# Patient Record
Sex: Male | Born: 1960 | Race: Black or African American | Hispanic: No | Marital: Single | Smoking: Former smoker
Health system: Southern US, Community
[De-identification: ages and names within clinical notes are randomized; demographics above are authoritative.]

## PROBLEM LIST (undated history)

## (undated) DIAGNOSIS — I639 Cerebral infarction, unspecified: Secondary | ICD-10-CM

## (undated) DIAGNOSIS — J449 Chronic obstructive pulmonary disease, unspecified: Secondary | ICD-10-CM

## (undated) DIAGNOSIS — I10 Essential (primary) hypertension: Secondary | ICD-10-CM

## (undated) HISTORY — PX: HEMORROIDECTOMY: SUR656

## (undated) HISTORY — PX: PROSTATE BIOPSY: SHX241

## (undated) HISTORY — PX: WRIST SURGERY: SHX841

---

## 2015-02-27 ENCOUNTER — Emergency Department (HOSPITAL_BASED_OUTPATIENT_CLINIC_OR_DEPARTMENT_OTHER)
Admission: EM | Admit: 2015-02-27 | Discharge: 2015-02-27 | Disposition: A | Discharge status: Home / Self Care | Payer: No Typology Code available for payment source | Attending: Emergency Medicine | Admitting: Emergency Medicine

## 2015-02-27 ENCOUNTER — Encounter (HOSPITAL_BASED_OUTPATIENT_CLINIC_OR_DEPARTMENT_OTHER): Payer: Self-pay | Admitting: Emergency Medicine

## 2015-02-27 DIAGNOSIS — I10 Essential (primary) hypertension: Secondary | ICD-10-CM | POA: Diagnosis not present

## 2015-02-27 DIAGNOSIS — Y92410 Unspecified street and highway as the place of occurrence of the external cause: Secondary | ICD-10-CM | POA: Diagnosis not present

## 2015-02-27 DIAGNOSIS — S79912A Unspecified injury of left hip, initial encounter: Secondary | ICD-10-CM | POA: Diagnosis not present

## 2015-02-27 DIAGNOSIS — S199XXA Unspecified injury of neck, initial encounter: Secondary | ICD-10-CM | POA: Diagnosis present

## 2015-02-27 DIAGNOSIS — J449 Chronic obstructive pulmonary disease, unspecified: Secondary | ICD-10-CM | POA: Diagnosis not present

## 2015-02-27 DIAGNOSIS — S3992XA Unspecified injury of lower back, initial encounter: Secondary | ICD-10-CM | POA: Insufficient documentation

## 2015-02-27 DIAGNOSIS — Z7951 Long term (current) use of inhaled steroids: Secondary | ICD-10-CM | POA: Diagnosis not present

## 2015-02-27 DIAGNOSIS — M542 Cervicalgia: Secondary | ICD-10-CM

## 2015-02-27 DIAGNOSIS — Y9389 Activity, other specified: Secondary | ICD-10-CM | POA: Insufficient documentation

## 2015-02-27 DIAGNOSIS — Y998 Other external cause status: Secondary | ICD-10-CM | POA: Insufficient documentation

## 2015-02-27 DIAGNOSIS — M549 Dorsalgia, unspecified: Secondary | ICD-10-CM

## 2015-02-27 DIAGNOSIS — Z8673 Personal history of transient ischemic attack (TIA), and cerebral infarction without residual deficits: Secondary | ICD-10-CM | POA: Diagnosis not present

## 2015-02-27 DIAGNOSIS — Z79899 Other long term (current) drug therapy: Secondary | ICD-10-CM | POA: Diagnosis not present

## 2015-02-27 HISTORY — DX: Cerebral infarction, unspecified: I63.9

## 2015-02-27 HISTORY — DX: Chronic obstructive pulmonary disease, unspecified: J44.9

## 2015-02-27 HISTORY — DX: Essential (primary) hypertension: I10

## 2015-02-27 MED ORDER — METHOCARBAMOL 500 MG PO TABS: 500.0000 mg | ORAL_TABLET | Freq: Two times a day (BID) | ORAL | Status: DC

## 2015-02-27 MED ORDER — NAPROXEN 500 MG PO TABS: 500.0000 mg | ORAL_TABLET | Freq: Two times a day (BID) | ORAL | Status: AC
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
## 2015-04-12 ENCOUNTER — Ambulatory Visit: Payer: No Typology Code available for payment source | Attending: Chiropractic Medicine | Admitting: Physical Therapy

## 2015-04-12 ENCOUNTER — Encounter: Payer: Self-pay | Admitting: Physical Therapy

## 2015-04-12 DIAGNOSIS — M542 Cervicalgia: Secondary | ICD-10-CM | POA: Insufficient documentation

## 2015-04-12 DIAGNOSIS — M436 Torticollis: Secondary | ICD-10-CM | POA: Insufficient documentation

## 2015-04-12 DIAGNOSIS — M545 Low back pain, unspecified: Secondary | ICD-10-CM

## 2015-04-12 NOTE — Patient Instructions (Signed)
AROM: Lateral Neck Flexion   Slowly tilt head toward one shoulder, then the other. Hold each position _3__ seconds. Repeat __10__ times per set. Do _2___ sets per session. Do _2___ sessions per day.  AROM: Neck Flexion   Bend head forward. Hold _3___ seconds. Repeat _10___ times per set. Do _2___ sets per session. Do _2___ sessions per day.   Elevation / Depression   While slowly inhaling, bring shoulders toward ears. Hold _2__ seconds. Slowly exhale while relaxing shoulders backward and downward. Repeat _10__ times. Do _2__ times per day.SCAPULA: Retraction   Hold cane with both hands. Pinch shoulder blades together. Do not shrug shoulders. Hold ___ seconds. Use___ lb weight on cane. ___ reps per set, ___ sets per day, ___ days per week  Flexibility: Neck Retraction   Pull head straight back, keeping eyes and jaw level. Repeat ____ times per set. Do ____ sets per session. Do ____ sessions per day.  
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
## 2015-04-15 ENCOUNTER — Ambulatory Visit: Payer: No Typology Code available for payment source | Admitting: Physical Therapy

## 2015-04-19 ENCOUNTER — Ambulatory Visit: Payer: No Typology Code available for payment source | Attending: Chiropractic Medicine | Admitting: Physical Therapy

## 2015-04-19 ENCOUNTER — Encounter: Payer: Self-pay | Admitting: Physical Therapy

## 2015-04-19 DIAGNOSIS — M542 Cervicalgia: Secondary | ICD-10-CM | POA: Insufficient documentation

## 2015-04-19 DIAGNOSIS — M545 Low back pain, unspecified: Secondary | ICD-10-CM

## 2015-04-19 DIAGNOSIS — M436 Torticollis: Secondary | ICD-10-CM | POA: Insufficient documentation

## 2015-04-19 NOTE — Therapy (Signed)
[facility] [place] [address] [place], [place], [address] Phone: [phone]   Fax:  [phone]  Physical Therapy Treatment  Patient Details  Name: Shawn Preston MRN: [id number] Date of Birth: [birth year] Referring Provider: Mauri Reading  Encounter Date: 04/19/2015      PT End of Session - 04/19/15 1557    Visit Number 2   Date for PT Re-Evaluation 06/10/15   PT Start Time 1522   PT Stop Time 1557   PT Time Calculation (min) 35 min   Activity Tolerance Patient tolerated treatment well   Behavior During Therapy [facility] for tasks assessed/performed      Past Medical History  Diagnosis Date  . Hypertension   . Stroke (cerebrum) (HCC)   . COPD (chronic obstructive pulmonary disease) [facility])     Past Surgical History  Procedure Laterality Date  . Wrist surgery    . Hemorroidectomy      There were no vitals filed for this visit.  Visit Diagnosis:  Stiffness of cervical spine  Cervical pain  Midline low back pain without sciatica      Subjective Assessment - 04/19/15 1526    Subjective Im doing alright just a little pain between my shoulder and lower back, but its not that bad today.   Currently in Pain? Yes   Pain Score 6    Pain Location Back   Pain Orientation Upper                         [facility] Adult PT Treatment/Exercise - 04/19/15 0001    Exercises   Exercises Lumbar   Lumbar Exercises: Aerobic   Tread Mill NuStep L5 x 6 minutes   UBE (Upper Arm Bike) Level 3 x 6 minutes   Lumbar Exercises: Machines for Strengthening   Other Lumbar Machine Exercise seated row 20#, lat pull 20#   Lumbar Exercises: Standing   Row Theraband;Both;15 reps  2 sets    Theraband Level (Row) Level 3 (Green)   Shoulder Extension 15 reps;Theraband  2 sets    Theraband Level (Shoulder Extension) Level 2 (Red)   Other Standing Lumbar Exercises standing rev grip rows #25 2x15; Shoulder ER red Tband 2x15     Other  Standing Lumbar Exercises Horiz Shoulder abd red Tband 2x15; Bicep curls #4 2x15                    PT Short Term Goals - 04/12/15 1650    PT SHORT TERM GOAL #1   Title independent with initial HEP   Time 2   Period Weeks   Status New           PT Long Term Goals - 04/12/15 1650    PT LONG TERM GOAL #1   Title understand proper posture and body mechanics   Time 8   Period Weeks   Status New   PT LONG TERM GOAL #2   Title decrease pain 50%   Time 8   Period Weeks   Status New   PT LONG TERM GOAL #3   Title increase lumbar ROM 25%   Time 8   Period Weeks   Status New   PT LONG TERM GOAL #4   Title increase cervical ROM 25%   Time 8   Period Weeks   Status New               Plan - 04/19/15 1558  Clinical Impression Statement  Pt 6 minutes late for PT session. Pt tolerated gym level interventions well. Pt repots soreness in RUE with horizontal shoulder abduction. Pt reports that he feels better after PT session.   Pt will benefit from skilled therapeutic intervention in order to improve on the following deficits Decreased mobility;Decreased range of motion;Decreased strength;Increased muscle spasms;Pain;Improper body mechanics;Postural dysfunction   Rehab Potential Good   PT Frequency 2x / week   PT Duration 8 weeks   PT Treatment/Interventions ADLs/Self Care Home Management;Electrical Stimulation;Moist Heat;Therapeutic exercise;Ultrasound;Patient/family education;Manual techniques;Passive range of motion;Taping   PT Next Visit Plan slowly add exercises        Problem List There are no active problems to display for this patient.   Grayce Sessions, PTA  04/19/2015, 4:00 PM  [facility] [place] [address] [place], [place], [address] Phone: [phone]   Fax:  [phone]  Name: Shawn Preston MRN: [id number] Date of Birth: 1960-03-17

## 2015-04-21 ENCOUNTER — Encounter: Payer: Self-pay | Admitting: Physical Therapy

## 2015-04-21 ENCOUNTER — Ambulatory Visit: Payer: No Typology Code available for payment source | Admitting: Physical Therapy

## 2015-04-21 DIAGNOSIS — M436 Torticollis: Secondary | ICD-10-CM

## 2015-04-21 DIAGNOSIS — M542 Cervicalgia: Secondary | ICD-10-CM

## 2015-04-21 DIAGNOSIS — M545 Low back pain, unspecified: Secondary | ICD-10-CM

## 2015-04-21 NOTE — Therapy (Signed)
[facility] [place] [address] [place], [place], [address] Phone: [phone]   Fax:  [phone]  Physical Therapy Treatment  Patient Details  Name: Shawn Preston MRN: [id number] Date of Birth: 01/26/1961 Referring Provider: Fricke  Encounter Date: 04/21/2015      PT End of Session - 04/21/15 1553    Visit Number 3   Date for PT Re-Evaluation 06/10/15   PT Start Time 1515   PT Stop Time 1556   PT Time Calculation (min) 41 min   Activity Tolerance Patient tolerated treatment well   Behavior During Therapy [facility] for tasks assessed/performed      Past Medical History  Diagnosis Date  . Hypertension   . Stroke (cerebrum) (HCC)   . COPD (chronic obstructive pulmonary disease) (HCC)     Past Surgical History  Procedure Laterality Date  . Wrist surgery    . Hemorroidectomy      There were no vitals filed for this visit.  Visit Diagnosis:  Stiffness of cervical spine  Cervical pain  Midline low back pain without sciatica      Subjective Assessment - 04/21/15 1522    Subjective Pt reports that he is doing all right, no soreness or pain.   Currently in Pain? No/denies   Pain Score 0-No pain            [facility] PT Assessment - 04/21/15 0001    AROM   Overall AROM Comments Cervical ROM Rotation and side bending WNL; Lumbar ROm decreased 25% ext.                     [facility] Adult PT Treatment/Exercise - 04/21/15 0001    Lumbar Exercises: Aerobic   Tread Mill NuStep L5 x 6 minutes   Lumbar Exercises: Machines for Strengthening   Cybex Knee Extension #15 3x10   Cybex Knee Flexion #25 3x10   Leg Press #40 2x15    Other Lumbar Machine Exercise seated row 20#, lat pull 20#   Lumbar Exercises: Standing   Other Standing Lumbar Exercises standing rev grip rows #35 2x15; Shoulder ER red Tband 2x15     Other Standing Lumbar Exercises Horiz Shoulder abd red Tband 2x15                  PT Short  Term Goals - 04/21/15 1552    PT SHORT TERM GOAL #1   Title independent with initial HEP   Status Partially Met           PT Long Term Goals - 04/21/15 1553    PT LONG TERM GOAL #2   Title decrease pain 50%   Status Partially Met   PT LONG TERM GOAL #3   Title increase lumbar ROM 25%   Status Partially Met   PT LONG TERM GOAL #4   Title increase cervical ROM 25%   Status Achieved               Plan - 04/21/15 1555    Clinical Impression Statement Pt reports that he is doing well, completed all interventions this day able to progress to leg press intervention without issue. Pt does fatigue with exercises.   Pt will benefit from skilled therapeutic intervention in order to improve on the following deficits Decreased mobility;Decreased range of motion;Decreased strength;Increased muscle spasms;Pain;Improper body mechanics;Postural dysfunction   Rehab Potential Good   PT Frequency 2x / week   PT Duration 8 weeks     PT Treatment/Interventions ADLs/Self Care Home Management;Electrical Stimulation;Moist Heat;Therapeutic exercise;Ultrasound;Patient/family education;Manual techniques;Passive range of motion;Taping   PT Next Visit Plan functional training        Problem List There are no active problems to display for this patient.   Ronald G Pemberton, PTA  04/21/2015, 3:57 PM  [facility] [place] [address] [place], [place], [address] Phone: [phone]   Fax:  [phone]  Name: Shea Diep MRN: [id number] Date of Birth: 06/04/1960     

## 2015-04-26 ENCOUNTER — Encounter: Payer: Self-pay | Admitting: Physical Therapy

## 2015-04-26 ENCOUNTER — Ambulatory Visit: Payer: No Typology Code available for payment source | Admitting: Physical Therapy

## 2015-04-26 DIAGNOSIS — M545 Low back pain, unspecified: Secondary | ICD-10-CM

## 2015-04-26 DIAGNOSIS — M542 Cervicalgia: Secondary | ICD-10-CM

## 2015-04-26 DIAGNOSIS — M436 Torticollis: Secondary | ICD-10-CM | POA: Diagnosis not present

## 2015-04-26 NOTE — Therapy (Signed)
[place], [place], [address] Phone: [phone]   Fax:  [phone]  Physical Therapy Treatment  Patient Details  Name: Shawn Preston MRN: [id number] Date of Birth: 05-13-1960 Referring Provider: Berta Minor  Encounter Date: 04/26/2015      PT End of Session - 04/26/15 1556    Visit Number 4   PT Start Time 1520   PT Stop Time 1600   PT Time Calculation (min) 40 min   Activity Tolerance Patient tolerated treatment well   Behavior During Therapy [facility] for tasks assessed/performed      Past Medical History  Diagnosis Date  . Hypertension   . Stroke (cerebrum) ([place])   . COPD (chronic obstructive pulmonary disease) [facility])     Past Surgical History  Procedure Laterality Date  . Wrist surgery    . Hemorroidectomy      There were no vitals filed for this visit.  Visit Diagnosis:  Midline low back pain without sciatica  Cervical pain  Stiffness of cervical spine      Subjective Assessment - 04/26/15 1522    Subjective "I feel all right, been laying around too much."   Currently in Pain? No/denies   Pain Score 0-No pain            [facility] PT Assessment - 04/26/15 0001    AROM   Overall AROM Comments Cervical ROM Rotation and side bending WNL; Lumbar ROM WNL   Strength   Overall Strength Comments left LE 4/5, right 4+/5, left UE 4-/5                     [facility] Adult PT Treatment/Exercise - 04/26/15 0001    Ambulation/Gait   Stairs Yes   Stairs Assistance 6: Modified independent (Device/Increase time)   Stair Management Technique One rail Right;One rail Left;Alternating pattern;Forwards   Number of Stairs --  3 flights   Height of Stairs 6   Gait Comments Pt reports that his LLE feel heavy as he fatigues   Lumbar Exercises: Aerobic   Tread Mill NuStep L5 x 6 minutes   UBE (Upper Arm Bike) constant work 30 watts x4 min    Lumbar Exercises: Machines for Strengthening    Cybex Knee Extension #20 2x15   Cybex Knee Flexion #25 2x15   Leg Press #50 2x15    Other Lumbar Machine Exercise seated row 25#, lat pull 25#, chest press #25                   PT Short Term Goals - 04/21/15 1552    PT SHORT TERM GOAL #1   Title independent with initial HEP   Status Partially Met           PT Long Term Goals - 04/26/15 1533    PT LONG TERM GOAL #3   Title increase lumbar ROM 25%   Status Achieved               Plan - 04/26/15 1556    Clinical Impression Statement Pt has progressed towards all goals. Tolerated stair negotiation without c/o pain. Does demo a decrease functional endurance with stair negotiation required multiple rest breaks.   Pt will benefit from skilled therapeutic intervention in order to improve on the following deficits Decreased mobility;Decreased range of motion;Decreased strength;Increased muscle spasms;Pain;Improper body mechanics;Postural dysfunction   Rehab Potential Good   PT Frequency 2x / week   PT Duration 8  weeks   PT Treatment/Interventions ADLs/Self Care Home Management;Electrical Stimulation;Moist Heat;Therapeutic exercise;Ultrasound;Patient/family education;Manual techniques;Passive range of motion;Taping   PT Next Visit Plan D/C PT         Problem List There are no active problems to display for this patient.   Scot Jun, PTA 04/26/2015, 3:58 PM  [place], [place], [address] Phone: [phone]   Fax:  [phone]  Name: Shawn Preston MRN: [id number] Date of Birth: [birth year]

## 2015-04-28 ENCOUNTER — Encounter: Payer: Self-pay | Admitting: Physical Therapy

## 2015-04-28 ENCOUNTER — Ambulatory Visit: Payer: No Typology Code available for payment source | Admitting: Physical Therapy

## 2015-04-28 DIAGNOSIS — M436 Torticollis: Secondary | ICD-10-CM | POA: Diagnosis not present

## 2015-04-28 DIAGNOSIS — M545 Low back pain, unspecified: Secondary | ICD-10-CM

## 2015-04-28 DIAGNOSIS — M542 Cervicalgia: Secondary | ICD-10-CM

## 2015-04-28 NOTE — Therapy (Signed)
[facility] [place], [place], [address] Phone: [phone]   Fax:  [phone]  Physical Therapy Treatment  Patient Details  Name: Jearld Hemp MRN: [id number] Date of Birth: [birth year] Referring Provider: Berta Minor  Encounter Date: 04/28/2015      PT End of Session - 04/28/15 1553    Visit Number 5   Date for PT Re-Evaluation 06/10/15   PT Start Time 1517   PT Stop Time 1556   PT Time Calculation (min) 39 min   Activity Tolerance Patient tolerated treatment well   Behavior During Therapy [place] [facility] for tasks assessed/performed      Past Medical History  Diagnosis Date  . Hypertension   . Stroke (cerebrum) ([place])   . COPD (chronic obstructive pulmonary disease) [facility])     Past Surgical History  Procedure Laterality Date  . Wrist surgery    . Hemorroidectomy      There were no vitals filed for this visit.  Visit Diagnosis:  Stiffness of cervical spine  Cervical pain  Midline low back pain without sciatica      Subjective Assessment - 04/28/15 1518    Subjective "Im alright no pain"   Currently in Pain? No/denies   Pain Score 0-No pain                         [facility] Adult PT Treatment/Exercise - 04/28/15 0001    Ambulation/Gait   Stairs Yes   Stairs Assistance 6: Modified independent (Device/Increase time)   Stair Management Technique One rail Right;One rail Left;Alternating pattern;Forwards   Number of Stairs --  4 flights   Height of Stairs 6   Gait Comments Pt reports that his LLE feels heavy as he fatigues   Lumbar Exercises: Aerobic   Tread Mill NuStep L5 x 6 minutes   UBE (Upper Arm Bike) constant work 30watts x6 min    Lumbar Exercises: Machines for Strengthening   Leg Press #60 2x15    Other Lumbar Machine Exercise seated row 25#, lat pull 35#, chest press #25                   PT Short Term Goals - 04/28/15 1524    PT SHORT TERM GOAL #1   Title  independent with initial HEP   Status Achieved           PT Long Term Goals - 04/28/15 1524    PT LONG TERM GOAL #1   Title understand proper posture and body mechanics   Status Partially Met               Plan - 04/28/15 1553    Clinical Impression Statement Pt has progressed and completed most goals. Pt is satisfied with current functional and reports that he will continue being active at the gym.   Pt will benefit from skilled therapeutic intervention in order to improve on the following deficits Decreased mobility;Decreased range of motion;Decreased strength;Increased muscle spasms;Pain;Improper body mechanics;Postural dysfunction   Rehab Potential Good   PT Frequency 2x / week   PT Duration 8 weeks   PT Treatment/Interventions ADLs/Self Care Home Management;Electrical Stimulation;Moist Heat;Therapeutic exercise;Ultrasound;Patient/family education;Manual techniques;Passive range of motion;Taping   PT Next Visit Plan D/C PT       PHYSICAL THERAPY DISCHARGE SUMMARY  Visits from Start of Care: 4  Plan: Patient agrees to discharge.  Patient goals were partially met. Patient is being discharged due to  being pleased with the current functional level.  ?????        Problem List There are no active problems to display for this patient.   Scot Jun, PTA  04/28/2015, 3:56 PM  [address] [place], [place], [address] Phone: [phone]   Fax:  [phone]  Name: Hester Forget MRN: [id number] Date of Birth: 1960-05-28

## 2016-03-26 ENCOUNTER — Encounter (HOSPITAL_BASED_OUTPATIENT_CLINIC_OR_DEPARTMENT_OTHER): Payer: Self-pay | Admitting: Emergency Medicine

## 2016-03-26 ENCOUNTER — Emergency Department (HOSPITAL_BASED_OUTPATIENT_CLINIC_OR_DEPARTMENT_OTHER)
Admission: EM | Admit: 2016-03-26 | Discharge: 2016-03-26 | Disposition: A | Discharge status: Home / Self Care | Payer: Medicare HMO | Attending: Emergency Medicine | Admitting: Emergency Medicine

## 2016-03-26 ENCOUNTER — Emergency Department (HOSPITAL_BASED_OUTPATIENT_CLINIC_OR_DEPARTMENT_OTHER): Payer: Medicare HMO

## 2016-03-26 DIAGNOSIS — R05 Cough: Secondary | ICD-10-CM | POA: Diagnosis present

## 2016-03-26 DIAGNOSIS — I10 Essential (primary) hypertension: Secondary | ICD-10-CM | POA: Insufficient documentation

## 2016-03-26 DIAGNOSIS — J069 Acute upper respiratory infection, unspecified: Secondary | ICD-10-CM | POA: Insufficient documentation

## 2016-03-26 DIAGNOSIS — J449 Chronic obstructive pulmonary disease, unspecified: Secondary | ICD-10-CM | POA: Insufficient documentation

## 2016-03-26 DIAGNOSIS — Z79899 Other long term (current) drug therapy: Secondary | ICD-10-CM | POA: Insufficient documentation

## 2016-03-26 MED ORDER — AZITHROMYCIN 250 MG PO TABS: 250.0000 mg | ORAL_TABLET | Freq: Every day | ORAL | 0 refills | Status: AC

## 2016-03-26 MED ORDER — BENZONATATE 100 MG PO CAPS: 100.0000 mg | ORAL_CAPSULE | Freq: Three times a day (TID) | ORAL | 0 refills | Status: AC

## 2016-03-26 NOTE — ED Provider Notes (Signed)
[facility] Provider Note   CSN: [id number] Arrival date & time: 03/26/16  1254     History   Chief Complaint Chief Complaint  Patient presents with  . Cough    HPI Shawn Preston is a 56 y.o. male.  Patient with past medical history of COPD, hypertension, and stroke presents to the emergency department with chief complaint of cough. He states that he has had a productive cough 1 week. Contrary to nursing note, patient denies having body aches on my history. He does state that he has not had any fevers, chills, nausea, vomiting, or diarrhea. He reports that he has had mild sore throat. He has been using his inhaler as prescribed, denies any shortness of breath symptoms. He states that his reason for coming to be evaluated today was due to the persistent nature of the symptoms. He did get a flu shot and a pneumonia shot history.   The history is provided by the patient. No language interpreter was used.    Past Medical History:  Diagnosis Date  . COPD (chronic obstructive pulmonary disease) ([facility])   . Hypertension   . Stroke (cerebrum) ([facility])     There are no active problems to display for this patient.   Past Surgical History:  Procedure Laterality Date  . HEMORROIDECTOMY    . WRIST SURGERY         Home Medications    Prior to Admission medications   Medication Sig Start Date End Date Taking? Authorizing Provider  albuterol (PROVENTIL HFA;VENTOLIN HFA) 108 (90 BASE) MCG/ACT inhaler Inhale into the lungs every 6 (six) hours as needed for wheezing or shortness of breath.   Yes Historical Provider, MD  amLODipine (NORVASC) 10 MG tablet Take 10 mg by mouth daily.   Yes Historical Provider, MD  fluticasone-salmeterol (ADVAIR HFA) 230-21 MCG/ACT inhaler Inhale 2 puffs into the lungs 2 (two) times daily.   Yes Historical Provider, MD  gabapentin (NEURONTIN) 300 MG capsule Take 300 mg by mouth 3 (three) times daily.   Yes Historical Provider, MD  hydrOXYzine  (ATARAX/VISTARIL) 10 MG tablet Take 10 mg by mouth at bedtime as needed.   Yes Historical Provider, MD  omeprazole (PRILOSEC) 10 MG capsule Take 10 mg by mouth daily.   Yes Historical Provider, MD  methocarbamol (ROBAXIN) 500 MG tablet Take 1 tablet (500 mg total) by mouth 2 (two) times daily. 02/27/15   Garlon Hatchet, PA-C  naproxen (NAPROSYN) 500 MG tablet Take 1 tablet (500 mg total) by mouth 2 (two) times daily with a meal. 02/27/15   Garlon Hatchet, PA-C    Family History No family history on file.  Social History Social History  Substance Use Topics  . Smoking status: Never Smoker  . Smokeless tobacco: Never Used  . Alcohol use No     Allergies   Patient has no known allergies.   Review of Systems Review of Systems  Constitutional: Negative for chills and fever.  HENT: Positive for postnasal drip, rhinorrhea, sinus pressure, sneezing and sore throat.   Respiratory: Positive for cough. Negative for shortness of breath.   Cardiovascular: Negative for chest pain.  Gastrointestinal: Negative for abdominal pain, constipation, diarrhea, nausea and vomiting.  Genitourinary: Negative for dysuria.  All other systems reviewed and are negative.    Physical Exam Updated Vital Signs BP 120/88 (BP Location: Right Arm)   Pulse 82   Temp 98.1 F (36.7 C) (Oral)   Resp 20   Ht 6' (1.829 m)  Wt 78.9 kg   SpO2 97%   BMI 23.60 kg/m   Physical Exam Physical Exam  Constitutional: Pt  is oriented to person, place, and time. Appears well-developed and well-nourished. No distress.  HENT:  Head: Normocephalic and atraumatic.  Right Ear: Tympanic membrane, external ear and ear canal normal.  Left Ear: Tympanic membrane, external ear and ear canal normal.  Nose: Mucosal edema and moderate rhinorrhea present. No epistaxis. Right sinus exhibits no maxillary sinus tenderness and no frontal sinus tenderness. Left sinus exhibits no maxillary sinus tenderness and no frontal sinus  tenderness.  Mouth/Throat: Uvula is midline and mucous membranes are normal. Mucous membranes are not pale and not cyanotic. No oropharyngeal exudate, posterior oropharyngeal edema, posterior oropharyngeal erythema or tonsillar abscesses.  Eyes: Conjunctivae are normal. Pupils are equal, round, and reactive to light.  Neck: Normal range of motion and full passive range of motion without pain.  Cardiovascular: Normal rate and intact distal pulses.   Pulmonary/Chest: Effort normal and breath sounds normal. No stridor.  Clear and equal breath sounds without focal wheezes, rhonchi, rales  Abdominal: Soft. Bowel sounds are normal. There is no tenderness.  Musculoskeletal: Normal range of motion.  Lymphadenopathy:    Pthas no cervical adenopathy.  Neurological: Pt is alert and oriented to person, place, and time.  Skin: Skin is warm and dry. No rash noted. Pt is not diaphoretic.  Psychiatric: Normal mood and affect.  Nursing note and vitals reviewed.    ED Treatments / Results  Labs (all labs ordered are listed, but only abnormal results are displayed) Labs Reviewed - No data to display  EKG  EKG Interpretation None       Radiology Dg Chest 2 View  Result Date: 03/26/2016 CLINICAL DATA:  Productive Cough with green sputum and body aches x 4 dasys. Hx: Stroke, HTN, COPD EXAM: CHEST  2 VIEW COMPARISON:  None. FINDINGS: The heart size and mediastinal contours are within normal limits. Both lungs are clear. No pleural effusion or pneumothorax. The visualized skeletal structures are unremarkable. IMPRESSION: No active cardiopulmonary disease. Electronically Signed   By: Amie Portlandavid  Ormond M.D.   On: 03/26/2016 13:55    Procedures Procedures (including critical care time)  Medications Ordered in ED Medications - No data to display   Initial Impression / Assessment and Plan / ED Course  I have reviewed the triage vital signs and the nursing notes.  Pertinent labs & imaging results that  were available during my care of the patient were reviewed by me and considered in my medical decision making (see chart for details).  Clinical Course     Pt CXR negative for acute infiltrate. Patients symptoms are consistent with URI, likely viral etiology, but given that has COPD, has productive cough, and has been sick for a week will cover atypicals with azithromycin.  Verbalizes understanding and is agreeable with plan. Pt is hemodynamically stable & in NAD prior to dc.   Final Clinical Impressions(s) / ED Diagnoses   Final diagnoses:  Upper respiratory tract infection, unspecified type    New Prescriptions New Prescriptions   AZITHROMYCIN (ZITHROMAX) 250 MG TABLET    Take 1 tablet (250 mg total) by mouth daily. Take first 2 tablets together, then 1 every day until finished.   BENZONATATE (TESSALON) 100 MG CAPSULE    Take 1 capsule (100 mg total) by mouth every 8 (eight) hours.     Roxy HorsemanRobert Franklin Baumbach, PA-C 03/26/16 1512    Charlynne Panderavid Hsienta Yao, MD 03/26/16 2012

## 2016-03-26 NOTE — ED Triage Notes (Signed)
Productive Cough with green sputum and body aches x 4 days. Denies fever.

## 2017-12-30 IMAGING — CR DG CHEST 2V
2 series · 2 of 2 positions shown · non-contrast
Comparison: None.

CLINICAL DATA: Productive Cough with green sputum and body aches x
4 dasys. Hx: Stroke, HTN, COPD

EXAM:
CHEST  2 VIEW

[w chest pa]
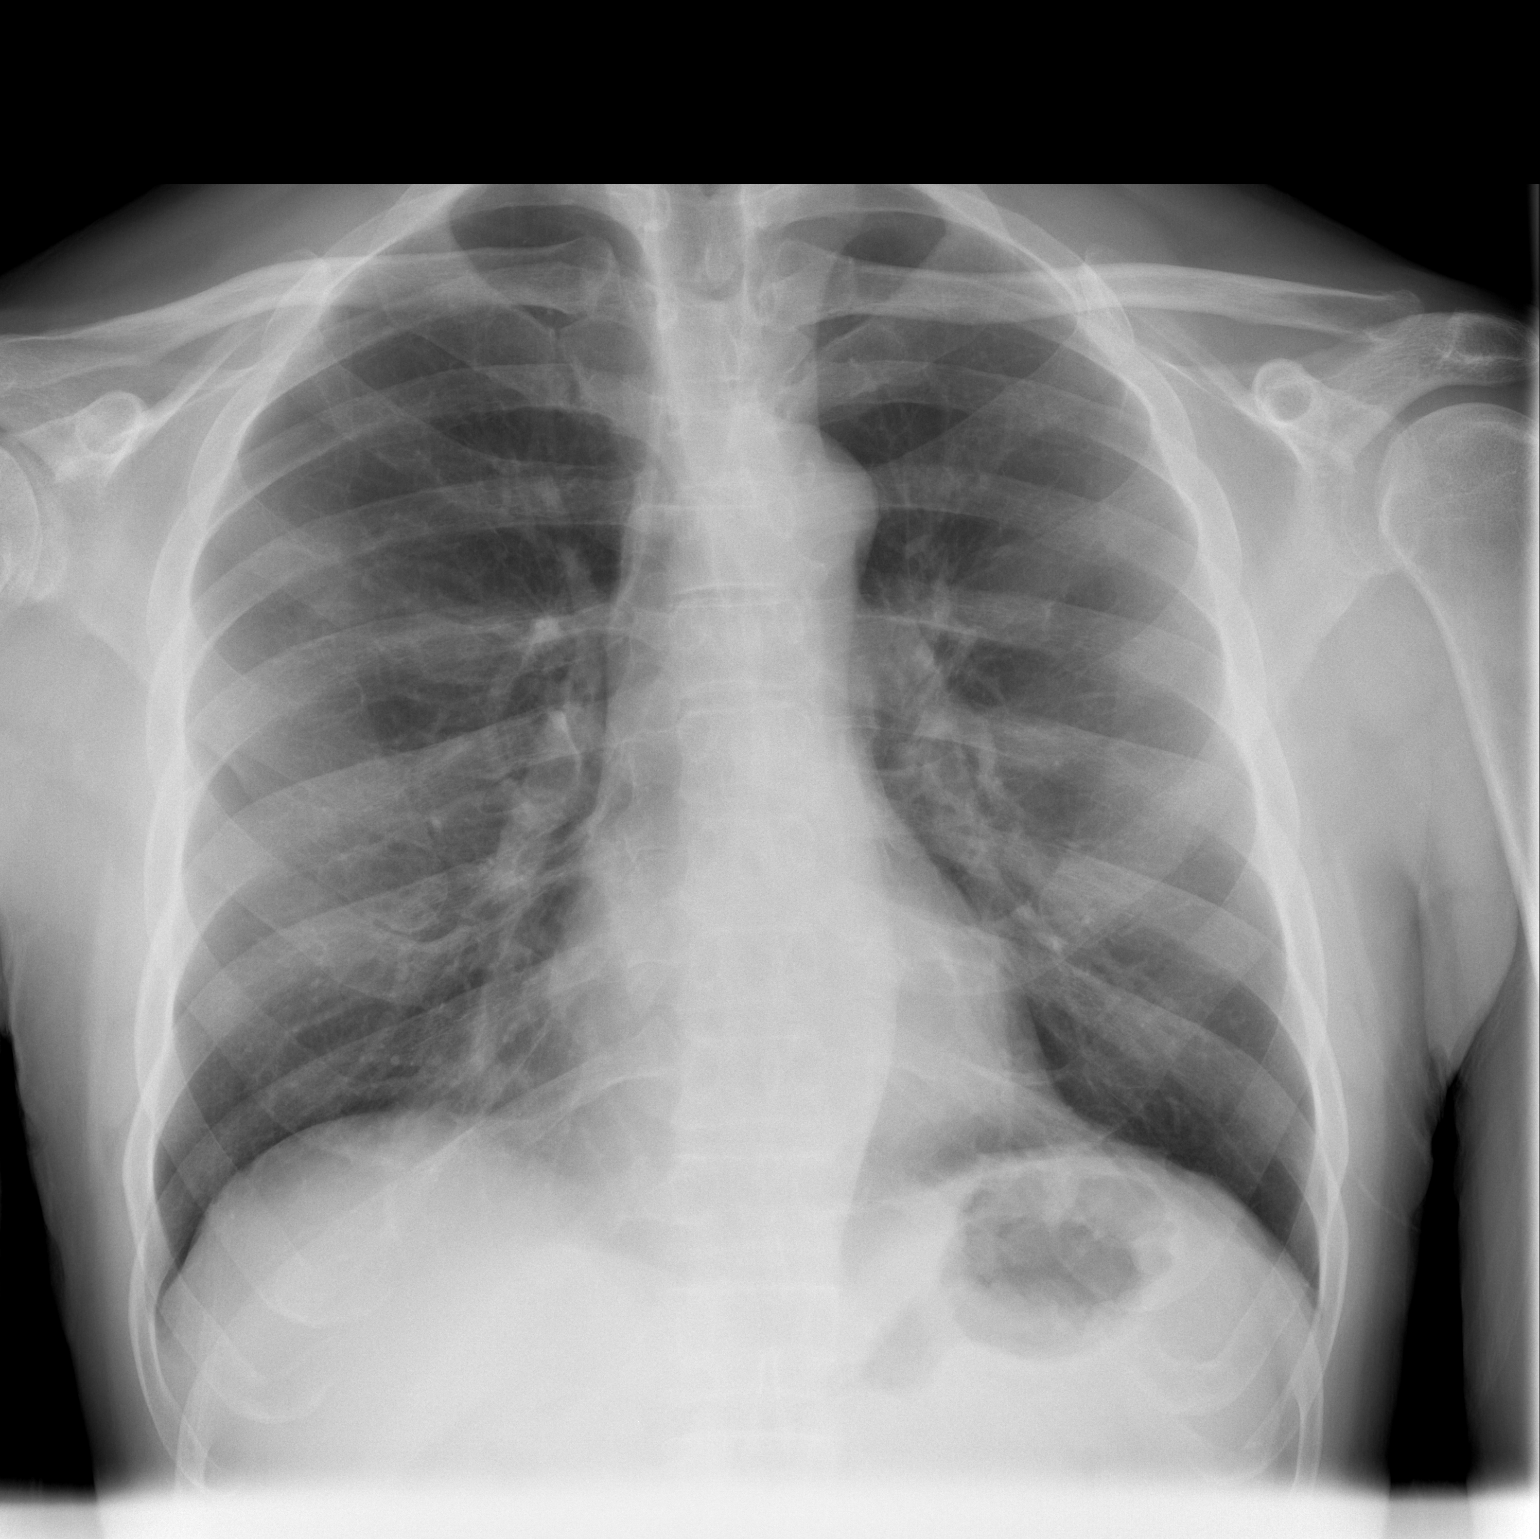

[w chest lat]
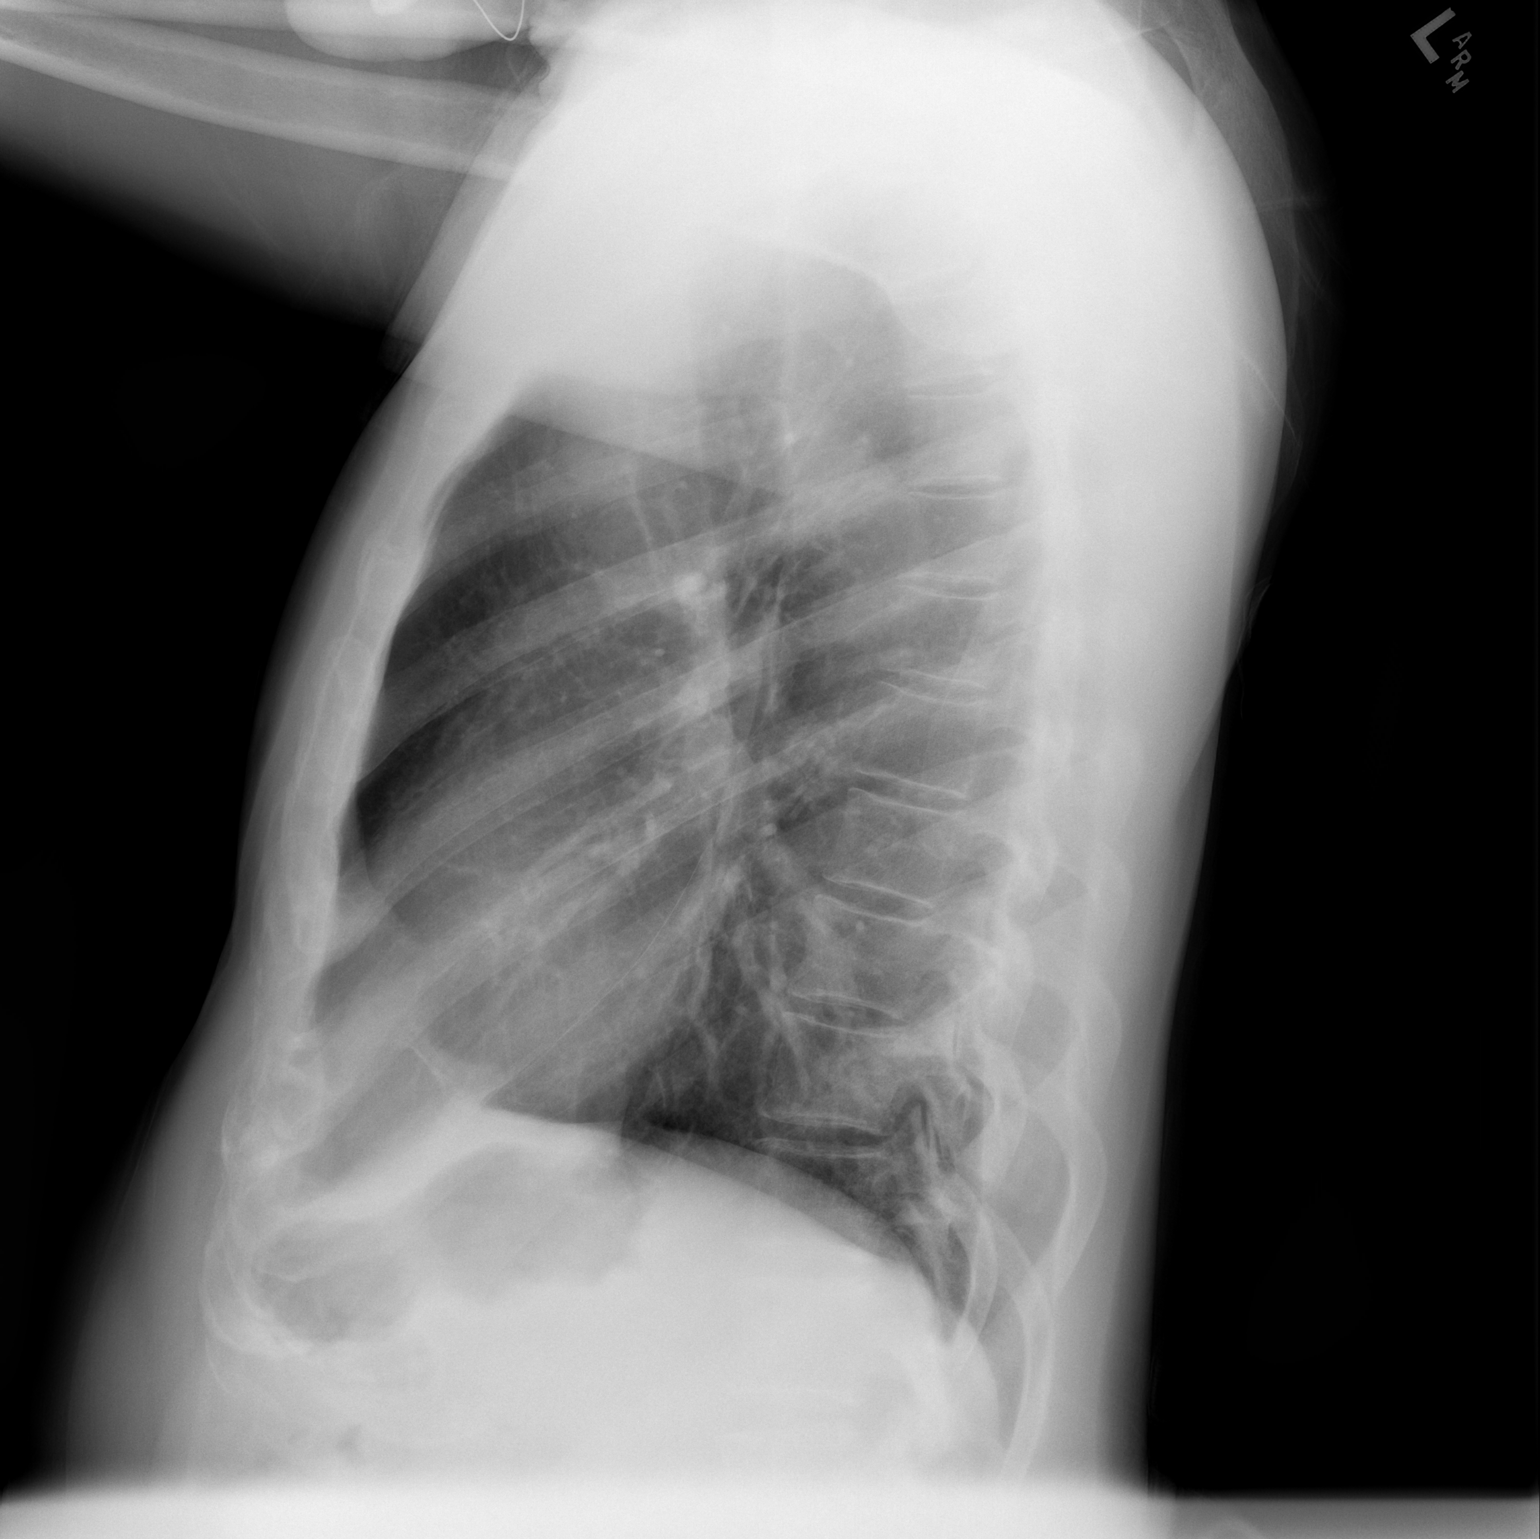

[2 of 2 positions shown; findings below may reference images not displayed]

FINDINGS: The heart size and mediastinal contours are within normal limits.
Both lungs are clear. No pleural effusion or pneumothorax. The
visualized skeletal structures are unremarkable.
IMPRESSION: No active cardiopulmonary disease.

## 2018-03-23 ENCOUNTER — Emergency Department (HOSPITAL_BASED_OUTPATIENT_CLINIC_OR_DEPARTMENT_OTHER)
Admission: EM | Admit: 2018-03-23 | Discharge: 2018-03-23 | Disposition: A | Discharge status: Home / Self Care | Payer: Medicare Other | Attending: Emergency Medicine | Admitting: Emergency Medicine

## 2018-03-23 ENCOUNTER — Other Ambulatory Visit: Payer: Self-pay

## 2018-03-23 ENCOUNTER — Emergency Department (HOSPITAL_BASED_OUTPATIENT_CLINIC_OR_DEPARTMENT_OTHER): Payer: Medicare Other

## 2018-03-23 ENCOUNTER — Encounter (HOSPITAL_BASED_OUTPATIENT_CLINIC_OR_DEPARTMENT_OTHER): Payer: Self-pay | Admitting: Emergency Medicine

## 2018-03-23 DIAGNOSIS — E876 Hypokalemia: Secondary | ICD-10-CM

## 2018-03-23 DIAGNOSIS — R1084 Generalized abdominal pain: Secondary | ICD-10-CM | POA: Diagnosis not present

## 2018-03-23 DIAGNOSIS — Z79899 Other long term (current) drug therapy: Secondary | ICD-10-CM | POA: Insufficient documentation

## 2018-03-23 DIAGNOSIS — Z8673 Personal history of transient ischemic attack (TIA), and cerebral infarction without residual deficits: Secondary | ICD-10-CM | POA: Insufficient documentation

## 2018-03-23 DIAGNOSIS — J449 Chronic obstructive pulmonary disease, unspecified: Secondary | ICD-10-CM | POA: Diagnosis not present

## 2018-03-23 DIAGNOSIS — I10 Essential (primary) hypertension: Secondary | ICD-10-CM | POA: Diagnosis not present

## 2018-03-23 LAB — COMPREHENSIVE METABOLIC PANEL
ALT: 13 U/L (ref 0–44)
ANION GAP: 6 (ref 5–15)
AST: 20 U/L (ref 15–41)
Albumin: 4.3 g/dL (ref 3.5–5.0)
Alkaline Phosphatase: 57 U/L (ref 38–126)
BILIRUBIN TOTAL: 1.7 mg/dL — AB (ref 0.3–1.2)
BUN: 7 mg/dL (ref 6–20)
CO2: 26 mmol/L (ref 22–32)
Calcium: 8.7 mg/dL — ABNORMAL LOW (ref 8.9–10.3)
Chloride: 106 mmol/L (ref 98–111)
Creatinine, Ser: 0.88 mg/dL (ref 0.61–1.24)
GFR calc Af Amer: >60 mL/min (ref >60)
Glucose, Bld: 91 mg/dL (ref 70–99)
Potassium: 3.2 mmol/L — ABNORMAL LOW (ref 3.5–5.1)
Sodium: 138 mmol/L (ref 135–145)
TOTAL PROTEIN: 7.3 g/dL (ref 6.5–8.1)

## 2018-03-23 LAB — CBC WITH DIFFERENTIAL/PLATELET
Abs Immature Granulocytes: 0.01 10*3/uL (ref 0.00–0.07)
BASOS ABS: 0 10*3/uL (ref 0.0–0.1)
BASOS PCT: 1 %
EOS ABS: 0 10*3/uL (ref 0.0–0.5)
Eosinophils Relative: 0 %
HCT: 42.6 % (ref 39.0–52.0)
Hemoglobin: 13.3 g/dL (ref 13.0–17.0)
IMMATURE GRANULOCYTES: 0 %
Lymphocytes Relative: 34 %
Lymphs Abs: 1.4 10*3/uL (ref 0.7–4.0)
MCH: 27.8 pg (ref 26.0–34.0)
MCHC: 31.2 g/dL (ref 30.0–36.0)
MCV: 89.1 fL (ref 80.0–100.0)
Monocytes Absolute: 0.5 10*3/uL (ref 0.1–1.0)
Monocytes Relative: 13 %
NEUTROS PCT: 52 %
NRBC: 0 % (ref 0.0–0.2)
Neutro Abs: 2 10*3/uL (ref 1.7–7.7)
PLATELETS: 180 10*3/uL (ref 150–400)
RBC: 4.78 MIL/uL (ref 4.22–5.81)
RDW: 11.9 % (ref 11.5–15.5)
WBC: 3.9 10*3/uL — AB (ref 4.0–10.5)

## 2018-03-23 LAB — URINALYSIS, ROUTINE W REFLEX MICROSCOPIC
Bilirubin Urine: NEGATIVE
Glucose, UA: NEGATIVE mg/dL
Hgb urine dipstick: NEGATIVE
KETONES UR: NEGATIVE mg/dL
LEUKOCYTES UA: NEGATIVE
NITRITE: NEGATIVE
PROTEIN: NEGATIVE mg/dL
Specific Gravity, Urine: <1.005 — ABNORMAL LOW (ref 1.005–1.030)
pH: 7 (ref 5.0–8.0)

## 2018-03-23 LAB — TROPONIN I: Troponin I: <0.03 ng/mL (ref <0.03)

## 2018-03-23 LAB — LIPASE, BLOOD: LIPASE: 47 U/L (ref 11–51)

## 2018-03-23 MED ORDER — IOPAMIDOL (ISOVUE-300) INJECTION 61%
100.0000 mL | Freq: Once | INTRAVENOUS | Status: AC | PRN
  Administered 2018-03-23: 100 mL via INTRAVENOUS

## 2018-03-23 MED ORDER — MORPHINE SULFATE (PF) 4 MG/ML IV SOLN
4.0000 mg | Freq: Once | INTRAVENOUS | Status: AC
  Administered 2018-03-23: 4 mg via INTRAVENOUS
  Filled 2018-03-23: qty 1

## 2018-03-23 MED ORDER — POTASSIUM CHLORIDE CRYS ER 20 MEQ PO TBCR
40.0000 meq | EXTENDED_RELEASE_TABLET | Freq: Once | ORAL | Status: AC
  Administered 2018-03-23: 40 meq via ORAL
  Filled 2018-03-23: qty 2

## 2018-03-23 MED ORDER — POTASSIUM CHLORIDE CRYS ER 20 MEQ PO TBCR: 20.0000 meq | EXTENDED_RELEASE_TABLET | Freq: Every day | ORAL | 0 refills | Status: AC

## 2018-03-23 NOTE — ED Provider Notes (Signed)
[facility] EMERGENCY DEPARTMENT Provider Note   CSN: [id number] Arrival date & time: 03/23/18  1304     History   Chief Complaint Chief Complaint  Patient presents with  . Abdominal Pain    HPI Shawn Preston is a 58 y.o. male.  HPI  58 year old male presents with abdominal pain.  Has been coming and going for about 2 months.  Saw his PCP and was recently prescribed some anticonstipation medicine such as Linzess.  He also was started on Reglan yesterday for chronic problems with trouble swallowing and regurgitation.  The abdominal pain seems to come and go and is a cramping sensation.  Mostly left-sided.  States that it usually occurs often after food, only minutes after eating.  No vomiting.  He is currently not constipated and is having loose stools.  He is not had a normal bowel movement in a couple months however as he has been alternating between the constipation and loose stools.  No urinary symptoms.  Pain is currently an 8.  Past Medical History:  Diagnosis Date  . COPD (chronic obstructive pulmonary disease) ([facility])   . Hypertension   . Stroke (cerebrum) ([facility])     There are no active problems to display for this patient.   Past Surgical History:  Procedure Laterality Date  . HEMORROIDECTOMY    . WRIST SURGERY          Home Medications    Prior to Admission medications   Medication Sig Start Date End Date Taking? Authorizing Provider  albuterol (PROVENTIL HFA;VENTOLIN HFA) 108 (90 BASE) MCG/ACT inhaler Inhale into the lungs every 6 (six) hours as needed for wheezing or shortness of breath.    [provider]  amLODipine (NORVASC) 10 MG tablet Take 10 mg by mouth daily.    [provider]  azithromycin (ZITHROMAX) 250 MG tablet Take 1 tablet (250 mg total) by mouth daily. Take first 2 tablets together, then 1 every day until finished. 03/26/16   Roxy Horseman, PA-C  benzonatate (TESSALON) 100 MG capsule Take 1 capsule (100 mg total)  by mouth every 8 (eight) hours. 03/26/16   Roxy Horseman, PA-C  fluticasone-salmeterol (ADVAIR HFA) 230-21 MCG/ACT inhaler Inhale 2 puffs into the lungs 2 (two) times daily.    [provider]  gabapentin (NEURONTIN) 300 MG capsule Take 300 mg by mouth 3 (three) times daily.    [provider]  hydrOXYzine (ATARAX/VISTARIL) 10 MG tablet Take 10 mg by mouth at bedtime as needed.    [provider]  methocarbamol (ROBAXIN) 500 MG tablet Take 1 tablet (500 mg total) by mouth 2 (two) times daily. 02/27/15   Garlon Hatchet, PA-C  naproxen (NAPROSYN) 500 MG tablet Take 1 tablet (500 mg total) by mouth 2 (two) times daily with a meal. 02/27/15   Garlon Hatchet, PA-C  omeprazole (PRILOSEC) 10 MG capsule Take 10 mg by mouth daily.    [provider]  potassium chloride SA (K-DUR,KLOR-CON) 20 MEQ tablet Take 1 tablet (20 mEq total) by mouth daily. 03/23/18   Pricilla Loveless, MD    Family History No family history on file.  Social History Social History   Tobacco Use  . Smoking status: Never Smoker  . Smokeless tobacco: Never Used  Substance Use Topics  . Alcohol use: No  . Drug use: No     Allergies   Patient has no known allergies.   Review of Systems Review of Systems  Constitutional: Negative for fever.  Gastrointestinal:  Positive for abdominal pain, constipation and diarrhea. Negative for blood in stool and vomiting.  Genitourinary: Negative for dysuria.  All other systems reviewed and are negative.    Physical Exam Updated Vital Signs BP (!) 144/88 (BP Location: Left Arm)   Pulse 72   Temp 98.2 F (36.8 C) (Oral)   Resp 18   Ht 6' (1.829 m)   Wt 78.5 kg   SpO2 98%   BMI 23.46 kg/m   Physical Exam Vitals signs and nursing note reviewed.  Constitutional:      General: He is not in acute distress.    Appearance: He is well-developed. He is not ill-appearing or diaphoretic.  HENT:     Head: Normocephalic and atraumatic.     Right  Ear: External ear normal.     Left Ear: External ear normal.     Nose: Nose normal.  Eyes:     General:        Right eye: No discharge.        Left eye: No discharge.  Neck:     Musculoskeletal: Neck supple.  Cardiovascular:     Rate and Rhythm: Normal rate and regular rhythm.     Heart sounds: Normal heart sounds.  Pulmonary:     Effort: Pulmonary effort is normal.     Breath sounds: Normal breath sounds.  Abdominal:     Palpations: Abdomen is soft.     Tenderness: There is abdominal tenderness (generalized, left sided worse than right).  Genitourinary:    Comments: No gross blood or stool in vault during rectal exam Skin:    General: Skin is warm and dry.  Neurological:     Mental Status: He is alert.  Psychiatric:        Mood and Affect: Mood is not anxious.      ED Treatments / Results  Labs (all labs ordered are listed, but only abnormal results are displayed) Labs Reviewed  COMPREHENSIVE METABOLIC PANEL - Abnormal; Notable for the following components:      Result Value   Potassium 3.2 (*)    Calcium 8.7 (*)    Total Bilirubin 1.7 (*)    All other components within normal limits  CBC WITH DIFFERENTIAL/PLATELET - Abnormal; Notable for the following components:   WBC 3.9 (*)    All other components within normal limits  URINALYSIS, ROUTINE W REFLEX MICROSCOPIC - Abnormal; Notable for the following components:   Specific Gravity, Urine <1.005 (*)    All other components within normal limits  LIPASE, BLOOD  TROPONIN I    EKG EKG Interpretation  Date/Time:  Saturday March 23 2018 14:06:28 EST Ventricular Rate:  59 PR Interval:    QRS Duration: 105 QT Interval:  441 QTC Calculation: 437 R Axis:   -10 Text Interpretation:  Sinus rhythm RSR' in V1 or V2, probably normal variant No old tracing to compare Confirmed by Pricilla Loveless [phone]) on 03/23/2018 2:23:35 PM   Radiology Ct Abdomen Pelvis W Contrast  Result Date: 03/23/2018 CLINICAL DATA:  Upper  abdominal pain worsening over the last few months. EXAM: CT ABDOMEN AND PELVIS WITH CONTRAST TECHNIQUE: Multidetector CT imaging of the abdomen and pelvis was performed using the standard protocol following bolus administration of intravenous contrast. CONTRAST:  ISOVUE-300 IOPAMIDOL (ISOVUE-300) INJECTION 61% COMPARISON:  11/22/2012 FINDINGS: Lower chest: Unremarkable. Hepatobiliary: Focal subcapsular hyperenhancement is identified in the lateral segment left liver with region measuring up to 3.2 cm There is no evidence for gallstones, gallbladder wall  thickening, or pericholecystic fluid. No intrahepatic or extrahepatic biliary dilation. Pancreas: No focal mass lesion. No dilatation of the main duct. No intraparenchymal cyst. No peripancreatic edema. Spleen: No splenomegaly. No focal mass lesion. Adrenals/Urinary Tract: No adrenal nodule or mass. Right kidney unremarkable. 2.5 cm cyst in the lower pole of the left kidney is new in the interval. No evidence for hydroureter. The urinary bladder appears normal for the degree of distention. Stomach/Bowel: Stomach is nondistended. No gastric wall thickening. No evidence of outlet obstruction. Duodenum is normally positioned as is the ligament of Treitz. No small bowel wall thickening. No small bowel dilatation. The terminal ileum is normal. The appendix is normal. No gross colonic mass. No colonic wall thickening. Vascular/Lymphatic: There is abdominal aortic atherosclerosis without aneurysm. There is no gastrohepatic or hepatoduodenal ligament lymphadenopathy. No intraperitoneal or retroperitoneal lymphadenopathy. No pelvic sidewall lymphadenopathy. Reproductive: The prostate gland and seminal vesicles are unremarkable. Other: No intraperitoneal free fluid. Musculoskeletal: Changes of avascular necrosis or seen in each femoral head without collapse. IMPRESSION: 1. No acute findings in the abdomen or pelvis. 2. 3 cm wedge-shaped subcapsular focus of  hyperenhancement identified in the lateral segment left liver. This is almost assuredly a transient hepatic attenuation difference related to underlying vascular anomaly. Nonemergent abdominal MRI without and with contrast recommended to confirm. 3. 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 4.  Aortic Atherosclerois (ICD10-170.0) Electronically Signed   By: Kennith CenterEric  Mansell M.D.   On: 03/23/2018 14:53    Procedures Procedures (including critical care time)  Medications Ordered in ED Medications  potassium chloride SA (K-DUR,KLOR-CON) CR tablet 40 mEq (has no administration in time range)  morphine 4 MG/ML injection 4 mg (4 mg Intravenous Given 03/23/18 1346)  iopamidol (ISOVUE-300) 61 % injection 100 mL (100 mLs Intravenous Contrast Given 03/23/18 1418)     Initial Impression / Assessment and Plan / ED Course  I have reviewed the triage vital signs and the nursing notes.  Pertinent labs & imaging results that were available during my care of the patient were reviewed by me and considered in my medical decision making (see chart for details).     There is no obvious cause of the patient's abdominal pain.  CT is overall reassuring.  Labs show mild hypokalemia which will be repleted.  He was advised to stop the anticonstipation meds as this is probably causing some of his diarrhea.  Highly doubt atypical ACS.  ECG and troponin are reassuring.  At this point, he appears stable for outpatient follow-up with his PCP and GI.  We discussed the abnormality seen on CT scan for his liver, and while this is probably benign he will need an outpatient MRI.  Final Clinical Impressions(s) / ED Diagnoses   Final diagnoses:  Generalized abdominal pain  Hypokalemia    ED Discharge Orders         Ordered    potassium chloride SA (K-DUR,KLOR-CON) 20 MEQ tablet  Daily     03/23/18 1509           Pricilla LovelessGoldston, Sharry Beining, MD 03/23/18 1517

## 2018-03-23 NOTE — ED Triage Notes (Signed)
Pt reports generalized abd pain x 2 months with nausea. Has seen PCP for same.

## 2018-03-23 NOTE — Discharge Instructions (Signed)
If your abdominal pain worsens or becomes severe, you develop severe chest pain, vomiting, fever, or any other new/concerning symptoms then return to the ER for evaluation.  Your CT scan shows a possible abnormality of your liver.  You will need a Nonemergent abdominal MRI without and with contrast that your primary care physician can order as an outpatient to evaluate the liver.

## 2019-05-22 ENCOUNTER — Ambulatory Visit: Payer: Medicare HMO | Attending: Internal Medicine

## 2019-05-22 DIAGNOSIS — Z23 Encounter for immunization: Secondary | ICD-10-CM

## 2019-05-22 NOTE — Progress Notes (Signed)
   Covid-19 Vaccination Clinic  Name:  Shawn Preston    MRN: [id number] DOB: 1960/05/12  05/22/2019  [name] was observed post Covid-19 immunization for 15 minutes without incident. He was provided with Vaccine Information Sheet and instruction to access the V-Safe system.   [name] was instructed to call 911 with any severe reactions post vaccine: Marland Kitchen Difficulty breathing  . Swelling of face and throat  . A fast heartbeat  . A bad rash all over body  . Dizziness and weakness   Immunizations Administered    Name Date Dose VIS Date Route   Pfizer COVID-19 Vaccine 05/22/2019  3:40 PM 0.3 mL 02/21/2019 Intramuscular   Manufacturer: [employer], [employer]   Lot: [id number]   NDC: [address]-5

## 2019-06-16 ENCOUNTER — Ambulatory Visit: Payer: Medicare Other | Attending: Internal Medicine

## 2019-06-16 DIAGNOSIS — Z23 Encounter for immunization: Secondary | ICD-10-CM

## 2019-06-16 NOTE — Progress Notes (Signed)
   Covid-19 Vaccination Clinic  Name:  Dequante Tremaine    MRN: [id number] DOB: 16-Jun-1960  06/16/2019  Mr. Cullars was observed post Covid-19 immunization for 15 minutes without incident. He was provided with Vaccine Information Sheet and instruction to access the V-Safe system.   Mr. Kaspar was instructed to call 911 with any severe reactions post vaccine: Marland Kitchen Difficulty breathing  . Swelling of face and throat  . A fast heartbeat  . A bad rash all over body  . Dizziness and weakness   Immunizations Administered    Name Date Dose VIS Date Route   Pfizer COVID-19 Vaccine 06/16/2019  3:30 PM 0.3 mL 02/21/2019 Intramuscular   Manufacturer: [employer], [employer]   Lot: [id number]   NDC: [address]-4

## 2020-10-09 ENCOUNTER — Other Ambulatory Visit: Payer: Self-pay

## 2020-10-09 ENCOUNTER — Emergency Department (HOSPITAL_BASED_OUTPATIENT_CLINIC_OR_DEPARTMENT_OTHER)
Admission: EM | Admit: 2020-10-09 | Discharge: 2020-10-09 | Disposition: A | Discharge status: Home / Self Care | Payer: Medicare Other | Attending: Emergency Medicine | Admitting: Emergency Medicine

## 2020-10-09 ENCOUNTER — Encounter (HOSPITAL_BASED_OUTPATIENT_CLINIC_OR_DEPARTMENT_OTHER): Payer: Self-pay

## 2020-10-09 ENCOUNTER — Emergency Department (HOSPITAL_BASED_OUTPATIENT_CLINIC_OR_DEPARTMENT_OTHER): Payer: Medicare Other

## 2020-10-09 DIAGNOSIS — K5900 Constipation, unspecified: Secondary | ICD-10-CM | POA: Insufficient documentation

## 2020-10-09 DIAGNOSIS — I10 Essential (primary) hypertension: Secondary | ICD-10-CM | POA: Diagnosis not present

## 2020-10-09 DIAGNOSIS — R1012 Left upper quadrant pain: Secondary | ICD-10-CM | POA: Diagnosis not present

## 2020-10-09 DIAGNOSIS — R1013 Epigastric pain: Secondary | ICD-10-CM | POA: Diagnosis not present

## 2020-10-09 DIAGNOSIS — R0602 Shortness of breath: Secondary | ICD-10-CM | POA: Diagnosis present

## 2020-10-09 DIAGNOSIS — Z79899 Other long term (current) drug therapy: Secondary | ICD-10-CM | POA: Diagnosis not present

## 2020-10-09 DIAGNOSIS — Z8616 Personal history of COVID-19: Secondary | ICD-10-CM | POA: Insufficient documentation

## 2020-10-09 DIAGNOSIS — Z7951 Long term (current) use of inhaled steroids: Secondary | ICD-10-CM | POA: Insufficient documentation

## 2020-10-09 DIAGNOSIS — J441 Chronic obstructive pulmonary disease with (acute) exacerbation: Secondary | ICD-10-CM | POA: Insufficient documentation

## 2020-10-09 LAB — COMPREHENSIVE METABOLIC PANEL
ALT: 7 U/L (ref 0–44)
AST: 17 U/L (ref 15–41)
Albumin: 4.2 g/dL (ref 3.5–5.0)
Alkaline Phosphatase: 53 U/L (ref 38–126)
Anion gap: 9 (ref 5–15)
BUN: 8 mg/dL (ref 6–20)
CO2: 25 mmol/L (ref 22–32)
Calcium: 8.9 mg/dL (ref 8.9–10.3)
Chloride: 105 mmol/L (ref 98–111)
Creatinine, Ser: 0.95 mg/dL (ref 0.61–1.24)
GFR, Estimated: >60 mL/min (ref >60)
Glucose, Bld: 95 mg/dL (ref 70–99)
Potassium: 3.4 mmol/L — ABNORMAL LOW (ref 3.5–5.1)
Sodium: 139 mmol/L (ref 135–145)
Total Bilirubin: 0.9 mg/dL (ref 0.3–1.2)
Total Protein: 7.4 g/dL (ref 6.5–8.1)

## 2020-10-09 LAB — CBC WITH DIFFERENTIAL/PLATELET
Abs Immature Granulocytes: 0.01 10*3/uL (ref 0.00–0.07)
Basophils Absolute: 0 10*3/uL (ref 0.0–0.1)
Basophils Relative: 0 %
Eosinophils Absolute: 0.1 10*3/uL (ref 0.0–0.5)
Eosinophils Relative: 3 %
HCT: 38.9 % — ABNORMAL LOW (ref 39.0–52.0)
Hemoglobin: 12 g/dL — ABNORMAL LOW (ref 13.0–17.0)
Immature Granulocytes: 0 %
Lymphocytes Relative: 35 %
Lymphs Abs: 1.7 10*3/uL (ref 0.7–4.0)
MCH: 26.5 pg (ref 26.0–34.0)
MCHC: 30.8 g/dL (ref 30.0–36.0)
MCV: 86.1 fL (ref 80.0–100.0)
Monocytes Absolute: 0.5 10*3/uL (ref 0.1–1.0)
Monocytes Relative: 11 %
Neutro Abs: 2.5 10*3/uL (ref 1.7–7.7)
Neutrophils Relative %: 51 %
Platelets: 229 10*3/uL (ref 150–400)
RBC: 4.52 MIL/uL (ref 4.22–5.81)
RDW: 14.1 % (ref 11.5–15.5)
WBC: 4.9 10*3/uL (ref 4.0–10.5)
nRBC: 0 % (ref 0.0–0.2)

## 2020-10-09 LAB — LIPASE, BLOOD: Lipase: 45 U/L (ref 11–51)

## 2020-10-09 LAB — BRAIN NATRIURETIC PEPTIDE: B Natriuretic Peptide: 5.7 pg/mL (ref 0.0–100.0)

## 2020-10-09 LAB — D-DIMER, QUANTITATIVE: D-Dimer, Quant: 3.43 ug/mL-FEU — ABNORMAL HIGH (ref 0.00–0.50)

## 2020-10-09 MED ORDER — ALUM & MAG HYDROXIDE-SIMETH 200-200-20 MG/5ML PO SUSP
30.0000 mL | Freq: Once | ORAL | Status: AC
  Administered 2020-10-09: 30 mL via ORAL
  Filled 2020-10-09: qty 30

## 2020-10-09 MED ORDER — IOHEXOL 350 MG/ML SOLN
100.0000 mL | Freq: Once | INTRAVENOUS | Status: AC | PRN
  Administered 2020-10-09: 100 mL via INTRAVENOUS

## 2020-10-09 MED ORDER — LIDOCAINE VISCOUS HCL 2 % MT SOLN
15.0000 mL | Freq: Once | OROMUCOSAL | Status: AC
  Administered 2020-10-09: 15 mL via ORAL
  Filled 2020-10-09: qty 15

## 2020-10-09 MED ORDER — DEXAMETHASONE SODIUM PHOSPHATE 10 MG/ML IJ SOLN
10.0000 mg | Freq: Once | INTRAMUSCULAR | Status: AC
  Administered 2020-10-09: 10 mg via INTRAMUSCULAR
  Filled 2020-10-09: qty 1

## 2020-10-09 NOTE — Discharge Instructions (Addendum)
Follow-up with your primary care doctor on Monday.  Return to emergency room if you have any worsening symptoms.

## 2020-10-09 NOTE — ED Notes (Signed)
ED Provider at bedside. 

## 2020-10-09 NOTE — ED Triage Notes (Signed)
"  Cough, sore throat and abdominal pain x 2 days" per pt

## 2020-10-09 NOTE — ED Notes (Signed)
Patient transported to CT 

## 2020-10-09 NOTE — ED Provider Notes (Addendum)
[facility] EMERGENCY DEPARTMENT Provider Note   CSN: [id number] Arrival date & time: 10/09/20  1749     History Chief Complaint  Patient presents with   Abdominal Pain    cough    Shawn Preston is a 60 y.o. male.  Patient is a 60 year old male with a history of COPD, hypertension, prior stroke who presents with cough and shortness of breath.  He said that he had COVID 3 weeks ago.  At that time he was having some shortness of breath.  He says that he took medications including prednisone, zinc and what he thinks was an antibiotic.  He said they seem to have gotten over it.  Over the last 2 days he has had some increased coughing and shortness of breath.  He has been wheezy.  He has used an inhaler which he usually only uses as needed please been using more frequently over the last 2 days.  He also complains of pain in his abdomen.  He denies any nausea or vomiting.  He does have some constipation which he has had a history of before.  He had a small bowel movement this morning.  No urinary symptoms.  No known fevers.  No associated chest pain.  He did have left knee surgery in June.  He has had some swelling in his left leg related to that but no worsening swelling.      Past Medical History:  Diagnosis Date   COPD (chronic obstructive pulmonary disease) (HCC)    Hypertension    Stroke (cerebrum) (HCC)     There are no problems to display for this patient.   Past Surgical History:  Procedure Laterality Date   HEMORROIDECTOMY     WRIST SURGERY         History reviewed. No pertinent family history.  Social History   Tobacco Use   Smoking status: Never   Smokeless tobacco: Never  Substance Use Topics   Alcohol use: No   Drug use: No    Home Medications Prior to Admission medications   Medication Sig Start Date End Date Taking? Authorizing Provider  albuterol (PROVENTIL HFA;VENTOLIN HFA) 108 (90 BASE) MCG/ACT inhaler Inhale into the lungs every 6 (six)  hours as needed for wheezing or shortness of breath.    [provider]  amLODipine (NORVASC) 10 MG tablet Take 10 mg by mouth daily.    [provider]  azithromycin (ZITHROMAX) 250 MG tablet Take 1 tablet (250 mg total) by mouth daily. Take first 2 tablets together, then 1 every day until finished. 03/26/16   Roxy Horseman, PA-C  benzonatate (TESSALON) 100 MG capsule Take 1 capsule (100 mg total) by mouth every 8 (eight) hours. 03/26/16   Roxy Horseman, PA-C  fluticasone-salmeterol (ADVAIR HFA) 230-21 MCG/ACT inhaler Inhale 2 puffs into the lungs 2 (two) times daily.    [provider]  gabapentin (NEURONTIN) 300 MG capsule Take 300 mg by mouth 3 (three) times daily.    [provider]  hydrOXYzine (ATARAX/VISTARIL) 10 MG tablet Take 10 mg by mouth at bedtime as needed.    [provider]  methocarbamol (ROBAXIN) 500 MG tablet Take 1 tablet (500 mg total) by mouth 2 (two) times daily. 02/27/15   Garlon Hatchet, PA-C  naproxen (NAPROSYN) 500 MG tablet Take 1 tablet (500 mg total) by mouth 2 (two) times daily with a meal. 02/27/15   Garlon Hatchet, PA-C  omeprazole (PRILOSEC) 10 MG capsule Take 10 mg by mouth  daily.    [provider]  potassium chloride SA (K-DUR,KLOR-CON) 20 MEQ tablet Take 1 tablet (20 mEq total) by mouth daily. 03/23/18   Pricilla LovelessGoldston, Scott, MD    Allergies    Patient has no known allergies.  Review of Systems   Review of Systems  Constitutional:  Positive for fatigue. Negative for chills, diaphoresis and fever.  HENT:  Negative for congestion, rhinorrhea and sneezing.   Eyes: Negative.   Respiratory:  Positive for cough, shortness of breath and wheezing. Negative for chest tightness.   Cardiovascular:  Negative for chest pain and leg swelling.  Gastrointestinal:  Positive for abdominal pain. Negative for blood in stool, diarrhea, nausea and vomiting.  Genitourinary:  Negative for difficulty urinating, flank pain,  frequency and hematuria.  Musculoskeletal:  Negative for arthralgias and back pain.  Skin:  Negative for rash.  Neurological:  Negative for dizziness, speech difficulty, weakness, numbness and headaches.   Physical Exam Updated Vital Signs BP (!) 137/91 (BP Location: Right Arm)   Pulse 70   Temp 97.9 F (36.6 C) (Oral)   Resp 20   Ht 6' (1.829 m)   Wt 72.6 kg   SpO2 96%   BMI 21.70 kg/m   Physical Exam Constitutional:      Appearance: He is well-developed.  HENT:     Head: Normocephalic and atraumatic.  Eyes:     Pupils: Pupils are equal, round, and reactive to light.  Cardiovascular:     Rate and Rhythm: Normal rate and regular rhythm.     Heart sounds: Normal heart sounds.  Pulmonary:     Effort: Pulmonary effort is normal. No respiratory distress.     Breath sounds: Wheezing present. No rales.     Comments: Few expiratory wheezes Chest:     Chest wall: No tenderness.  Abdominal:     General: Bowel sounds are normal.     Palpations: Abdomen is soft.     Tenderness: There is abdominal tenderness (Tenderness to the epigastrium and left upper quadrant). There is no guarding or rebound.  Musculoskeletal:        General: Normal range of motion.     Cervical back: Normal range of motion and neck supple.     Comments: Mild swelling of the left knee and lower leg, no significant warmth or erythema  Lymphadenopathy:     Cervical: No cervical adenopathy.  Skin:    General: Skin is warm and dry.     Findings: No rash.  Neurological:     Mental Status: He is alert and oriented to person, place, and time.    ED Results / Procedures / Treatments   Labs (all labs ordered are listed, but only abnormal results are displayed) Labs Reviewed  COMPREHENSIVE METABOLIC PANEL - Abnormal; Notable for the following components:      Result Value   Potassium 3.4 (*)    All other components within normal limits  CBC WITH DIFFERENTIAL/PLATELET - Abnormal; Notable for the following  components:   Hemoglobin 12.0 (*)    HCT 38.9 (*)    All other components within normal limits  D-DIMER, QUANTITATIVE - Abnormal; Notable for the following components:   D-Dimer, Quant 3.43 (*)    All other components within normal limits  LIPASE, BLOOD  BRAIN NATRIURETIC PEPTIDE    EKG EKG Interpretation  Date/Time:  Saturday October 09 2020 18:05:04 EDT Ventricular Rate:  97 PR Interval:  144 QRS Duration: 86 QT Interval:  370 QTC Calculation: 469  R Axis:   32 Text Interpretation: Normal sinus rhythm Minimal voltage criteria for LVH, may be normal variant ( Sokolow-Lyon ) Borderline ECG SINCE LAST TRACING HEART RATE HAS INCREASED Confirmed by Rolan Bucco [phone]) on 10/09/2020 8:49:28 PM  Radiology DG Chest 2 View  Result Date: 10/09/2020 CLINICAL DATA:  Cough and shortness of breath. EXAM: CHEST - 2 VIEW COMPARISON:  Radiograph 08/05/2020 FINDINGS: The cardiomediastinal contours are normal. The lungs are clear. Pulmonary vasculature is normal. No consolidation, pleural effusion, or pneumothorax. No acute osseous abnormalities are seen. IMPRESSION: No acute chest findings. Electronically Signed   By: Narda Rutherford M.D.   On: 10/09/2020 21:39   CT Angio Chest PE W/Cm &/Or Wo Cm  Result Date: 10/09/2020 CLINICAL DATA:  Cough, elevated D-dimer EXAM: CT ANGIOGRAPHY CHEST WITH CONTRAST TECHNIQUE: Multidetector CT imaging of the chest was performed using the standard protocol during bolus administration of intravenous contrast. Multiplanar CT image reconstructions and MIPs were obtained to evaluate the vascular anatomy. CONTRAST:  OMNIPAQUE IOHEXOL 350 MG/ML SOLN COMPARISON:  None. FINDINGS: Cardiovascular: There is adequate opacification of the a pulmonary arterial tree. No intraluminal filling defect identified to the segmental level to suggest acute pulmonary embolism. Central pulmonary arteries are of normal caliber. No significant coronary artery calcification. Global cardiac  size within normal limits. Small pericardial effusion. Mild atherosclerotic calcification within the thoracic aorta. No aortic aneurysm. Aberrant origin of the right subclavian artery. Arch vasculature appears widely patent at their origins. Mediastinum/Nodes: No pathologic thoracic adenopathy. Visualized thyroid is unremarkable. Esophagus is unremarkable. Lungs/Pleura: Lungs are clear. No pleural effusion or pneumothorax. Upper Abdomen: Simple cortical cyst noted within the visualized upper pole of the left kidney. No acute abnormality within the visualized upper abdomen. Musculoskeletal: No acute bone abnormality. No lytic or blastic bone lesions identified. Review of the MIP images confirms the above findings. IMPRESSION: No acute pulmonary embolism. No definite radiographic explanation for the patient's reported symptoms. Small pericardial effusion, stable since CT examination of the abdomen and pelvis of 03/23/2018. Aortic Atherosclerosis (ICD10-I70.0). Electronically Signed   By: Helyn Numbers MD   On: 10/09/2020 23:10    Procedures Procedures   Medications Ordered in ED Medications  dexamethasone (DECADRON) injection 10 mg (has no administration in time range)  alum & mag hydroxide-simeth (MAALOX/MYLANTA) 200-200-20 MG/5ML suspension 30 mL (30 mLs Oral Given 10/09/20 2050)    And  lidocaine (XYLOCAINE) 2 % viscous mouth solution 15 mL (15 mLs Oral Given 10/09/20 2050)  iohexol (OMNIPAQUE) 350 MG/ML injection 100 mL (100 mLs Intravenous Contrast Given 10/09/20 2238)    ED Course  I have reviewed the triage vital signs and the nursing notes.  Pertinent labs & imaging results that were available during my care of the patient were reviewed by me and considered in my medical decision making (see chart for details).    MDM Rules/Calculators/A&P                           Patient is a 60 year old male who presents with cough and shortness of breath.  He had some mild wheezing on exam.  No  hypoxia.  No associated chest pain.  He did have recent COVID infection.  His labs are nonconcerning.  His chest x-ray shows no evidence of pneumonia.  His D-dimer was elevated and therefore a CT scan was performed which showed no evidence of PE or other acute abnormality.  He has no increased work of breathing.  I feel his symptoms are consistent with a COPD exacerbation.  He was given a dose of Decadron in the ED and will continue using his albuterol at home.  He is otherwise well-appearing.  He has an appointment in 2 days to follow-up with his PCP.  He did have some epigastric type pain which completely resolved with a GI cocktail.  He is on an acid reducing medicine already.  I advised him to add Tums to this.  He does have some minor leg swelling in his left leg from his knee surgery.  He said the swelling has been improving since that surgery.  He does not have any worsening swelling or pain that would be more concerning for DVT.  However given his swelling and elevated D-dimer, will schedule a Doppler ultrasound for tomorrow.  There is no ultrasound tech here today.  He will follow-up with his PCP on Monday.  Return precautions were given. Final Clinical Impression(s) / ED Diagnoses Final diagnoses:  COPD exacerbation [facility])    Rx / DC Orders ED Discharge Orders     None        Rolan Bucco, MD 10/09/20 2322    Rolan Bucco, MD 10/09/20 [phone]

## 2020-10-10 ENCOUNTER — Emergency Department (HOSPITAL_BASED_OUTPATIENT_CLINIC_OR_DEPARTMENT_OTHER)
Admission: EM | Admit: 2020-10-10 | Discharge: 2020-10-10 | Discharge status: Absent without leave | Payer: Medicare Other

## 2020-10-10 ENCOUNTER — Ambulatory Visit (HOSPITAL_BASED_OUTPATIENT_CLINIC_OR_DEPARTMENT_OTHER)
Admission: EM | Admit: 2020-10-10 | Discharge: 2020-10-10 | Disposition: A | Discharge status: Home / Self Care | Payer: Medicare Other | Attending: Emergency Medicine | Admitting: Emergency Medicine

## 2020-10-10 DIAGNOSIS — R600 Localized edema: Secondary | ICD-10-CM | POA: Insufficient documentation

## 2020-10-10 DIAGNOSIS — M7989 Other specified soft tissue disorders: Secondary | ICD-10-CM | POA: Insufficient documentation

## 2020-10-25 ENCOUNTER — Encounter (HOSPITAL_BASED_OUTPATIENT_CLINIC_OR_DEPARTMENT_OTHER): Payer: Self-pay

## 2020-10-25 ENCOUNTER — Emergency Department (HOSPITAL_BASED_OUTPATIENT_CLINIC_OR_DEPARTMENT_OTHER)
Admission: EM | Admit: 2020-10-25 | Discharge: 2020-10-25 | Disposition: A | Discharge status: Home / Self Care | Payer: Medicare Other | Attending: Emergency Medicine | Admitting: Emergency Medicine

## 2020-10-25 ENCOUNTER — Emergency Department (HOSPITAL_BASED_OUTPATIENT_CLINIC_OR_DEPARTMENT_OTHER): Payer: Medicare Other

## 2020-10-25 ENCOUNTER — Other Ambulatory Visit: Payer: Self-pay

## 2020-10-25 DIAGNOSIS — Z79899 Other long term (current) drug therapy: Secondary | ICD-10-CM | POA: Diagnosis not present

## 2020-10-25 DIAGNOSIS — Z20822 Contact with and (suspected) exposure to covid-19: Secondary | ICD-10-CM | POA: Diagnosis not present

## 2020-10-25 DIAGNOSIS — I10 Essential (primary) hypertension: Secondary | ICD-10-CM | POA: Diagnosis not present

## 2020-10-25 DIAGNOSIS — J441 Chronic obstructive pulmonary disease with (acute) exacerbation: Secondary | ICD-10-CM | POA: Diagnosis not present

## 2020-10-25 DIAGNOSIS — R059 Cough, unspecified: Secondary | ICD-10-CM | POA: Diagnosis present

## 2020-10-25 LAB — CBC WITH DIFFERENTIAL/PLATELET
Abs Immature Granulocytes: 0.01 10*3/uL (ref 0.00–0.07)
Basophils Absolute: 0 10*3/uL (ref 0.0–0.1)
Basophils Relative: 0 %
Eosinophils Absolute: 0.2 10*3/uL (ref 0.0–0.5)
Eosinophils Relative: 3 %
HCT: 38 % — ABNORMAL LOW (ref 39.0–52.0)
Hemoglobin: 12 g/dL — ABNORMAL LOW (ref 13.0–17.0)
Immature Granulocytes: 0 %
Lymphocytes Relative: 28 %
Lymphs Abs: 1.8 10*3/uL (ref 0.7–4.0)
MCH: 26.4 pg (ref 26.0–34.0)
MCHC: 31.6 g/dL (ref 30.0–36.0)
MCV: 83.7 fL (ref 80.0–100.0)
Monocytes Absolute: 0.8 10*3/uL (ref 0.1–1.0)
Monocytes Relative: 12 %
Neutro Abs: 3.8 10*3/uL (ref 1.7–7.7)
Neutrophils Relative %: 57 %
Platelets: 198 10*3/uL (ref 150–400)
RBC: 4.54 MIL/uL (ref 4.22–5.81)
RDW: 14.3 % (ref 11.5–15.5)
WBC: 6.5 10*3/uL (ref 4.0–10.5)
nRBC: 0 % (ref 0.0–0.2)

## 2020-10-25 LAB — BASIC METABOLIC PANEL
Anion gap: 9 (ref 5–15)
BUN: 8 mg/dL (ref 6–20)
CO2: 24 mmol/L (ref 22–32)
Calcium: 9.1 mg/dL (ref 8.9–10.3)
Chloride: 104 mmol/L (ref 98–111)
Creatinine, Ser: 0.93 mg/dL (ref 0.61–1.24)
GFR, Estimated: >60 mL/min (ref >60)
Glucose, Bld: 93 mg/dL (ref 70–99)
Potassium: 3.5 mmol/L (ref 3.5–5.1)
Sodium: 137 mmol/L (ref 135–145)

## 2020-10-25 MED ORDER — PREDNISONE 10 MG PO TABS: 40.0000 mg | ORAL_TABLET | Freq: Every day | ORAL | 0 refills | Status: AC

## 2020-10-25 MED ORDER — METHYLPREDNISOLONE SODIUM SUCC 125 MG IJ SOLR
125.0000 mg | Freq: Once | INTRAMUSCULAR | Status: AC
  Administered 2020-10-25: 125 mg via INTRAVENOUS
  Filled 2020-10-25: qty 2

## 2020-10-25 MED ORDER — IPRATROPIUM-ALBUTEROL 0.5-2.5 (3) MG/3ML IN SOLN
3.0000 mL | RESPIRATORY_TRACT | Status: DC
  Administered 2020-10-25: 3 mL via RESPIRATORY_TRACT
  Filled 2020-10-25: qty 3

## 2020-10-25 NOTE — ED Triage Notes (Signed)
Pt c/o flu like sx x 1 weeks-states he had a neg covid test at [facility] 8/10

## 2020-10-25 NOTE — ED Notes (Signed)
Pt ambulated in the room. His O2 stats remained at 93% while ambulating. Pt denies any sob or chest pain.

## 2020-10-25 NOTE — ED Provider Notes (Signed)
[facility] EMERGENCY DEPARTMENT Provider Note   CSN: [id number] Arrival date & time: 10/25/20  1800     History Chief Complaint  Patient presents with   Cough    Shawn Preston is a 60 y.o. male with a past medical history of hypertension and COPD presenting today with shortness of breath.  He was seen in the emergency department on 7/30 for the same symptoms.  Had a negative lower extremity ultrasound and CTPA at the time.  He states that he has been short of breath since he had COVID in July and endorses associated wheezing.  Left knee surgery in June 2022.  Reports that he has also had a cough for the past few days that is productive of white phlegm.  Denies fevers, chills, lower extremity swelling.  Has been using his albuterol inhaler around 6 times a day.  He called the pharmacy and they told him he should come to the emergency department because he was using it too frequently.  Reports that he was seen by his cardiologist last week and they performed an echocardiogram.  He states that he has an appointment with them tomorrow to discuss the results.  Also due to follow-up with his pulmonologist on Wednesday to discuss his breathing.  Reports that he had a negative COVID test last Friday.  Says he was called to the notified of a positive test, however the office stated that this call was a mistake.   Cough Associated symptoms: shortness of breath   Associated symptoms: no chest pain, no chills, no ear pain, no fever, no headaches, no rash and no sore throat       Past Medical History:  Diagnosis Date   COPD (chronic obstructive pulmonary disease) (HCC)    Hypertension    Stroke (cerebrum) (HCC)     There are no problems to display for this patient.   Past Surgical History:  Procedure Laterality Date   HEMORROIDECTOMY     WRIST SURGERY         No family history on file.  Social History   Tobacco Use   Smoking status: Former    Types: Cigarettes    Smokeless tobacco: Never  Vaping Use   Vaping Use: Never used  Substance Use Topics   Alcohol use: No   Drug use: No    Home Medications Prior to Admission medications   Medication Sig Start Date End Date Taking? Authorizing Provider  albuterol (PROVENTIL HFA;VENTOLIN HFA) 108 (90 BASE) MCG/ACT inhaler Inhale into the lungs every 6 (six) hours as needed for wheezing or shortness of breath.    [provider]  amLODipine (NORVASC) 10 MG tablet Take 10 mg by mouth daily.    [provider]  azithromycin (ZITHROMAX) 250 MG tablet Take 1 tablet (250 mg total) by mouth daily. Take first 2 tablets together, then 1 every day until finished. 03/26/16   Roxy Horseman, PA-C  benzonatate (TESSALON) 100 MG capsule Take 1 capsule (100 mg total) by mouth every 8 (eight) hours. 03/26/16   Roxy Horseman, PA-C  fluticasone-salmeterol (ADVAIR HFA) 230-21 MCG/ACT inhaler Inhale 2 puffs into the lungs 2 (two) times daily.    [provider]  gabapentin (NEURONTIN) 300 MG capsule Take 300 mg by mouth 3 (three) times daily.    [provider]  hydrOXYzine (ATARAX/VISTARIL) 10 MG tablet Take 10 mg by mouth at bedtime as needed.    [provider]  methocarbamol (ROBAXIN) 500 MG tablet Take 1 tablet (  500 mg total) by mouth 2 (two) times daily. 02/27/15   Garlon Hatchet, PA-C  naproxen (NAPROSYN) 500 MG tablet Take 1 tablet (500 mg total) by mouth 2 (two) times daily with a meal. 02/27/15   Garlon Hatchet, PA-C  omeprazole (PRILOSEC) 10 MG capsule Take 10 mg by mouth daily.    [provider]  potassium chloride SA (K-DUR,KLOR-CON) 20 MEQ tablet Take 1 tablet (20 mEq total) by mouth daily. 03/23/18   Pricilla Loveless, MD    Allergies    Patient has no known allergies.  Review of Systems   Review of Systems  Constitutional:  Negative for chills and fever.  HENT:  Negative for ear pain, sneezing and sore throat.   Eyes:  Negative for pain and visual  disturbance.  Respiratory:  Positive for cough and shortness of breath.   Cardiovascular:  Negative for chest pain and palpitations.  Gastrointestinal:  Negative for abdominal pain, nausea and vomiting.  Genitourinary:  Negative for dysuria.  Musculoskeletal:  Negative for arthralgias and back pain.  Skin:  Negative for color change and rash.  Neurological:  Negative for dizziness, seizures, syncope, weakness, light-headedness, numbness and headaches.  Psychiatric/Behavioral:  Negative for confusion.   All other systems reviewed and are negative.  Physical Exam Updated Vital Signs BP (!) 154/100 (BP Location: Left Arm)   Pulse 79   Temp 98.3 F (36.8 C) (Oral)   Resp 18   Ht 6' (1.829 m)   Wt 71.7 kg   SpO2 96%   BMI 21.43 kg/m   Physical Exam Vitals and nursing note reviewed.  Constitutional:      General: He is not in acute distress.    Appearance: Normal appearance.  HENT:     Head: Normocephalic and atraumatic.     Nose: Nose normal.     Mouth/Throat:     Mouth: Mucous membranes are moist.     Pharynx: Oropharynx is clear.  Eyes:     General: No scleral icterus.    Conjunctiva/sclera: Conjunctivae normal.  Cardiovascular:     Rate and Rhythm: Normal rate and regular rhythm.     Pulses: Normal pulses.  Pulmonary:     Effort: No respiratory distress.     Breath sounds: Wheezing present. No rhonchi or rales.     Comments: Mildly increased effort.  Patient experiencing shortness of breath during history taking Chest:     Chest wall: No tenderness.  Abdominal:     General: Abdomen is flat.     Palpations: Abdomen is soft.     Tenderness: There is no abdominal tenderness.  Musculoskeletal:        General: No swelling or tenderness.     Right lower leg: No edema.     Left lower leg: No edema.     Comments: Patient's left lower extremity in a brace.  No swelling, warmth, tenderness.  Lymphadenopathy:     Cervical: No cervical adenopathy.  Skin:    General: Skin  is warm and dry.     Findings: No bruising or rash.  Neurological:     Mental Status: He is alert.     Motor: No weakness.  Psychiatric:        Mood and Affect: Mood normal.    ED Results / Procedures / Treatments   Labs (all labs ordered are listed, but only abnormal results are displayed) Labs Reviewed  CBC WITH DIFFERENTIAL/PLATELET - Abnormal; Notable for the following components:  Result Value   Hemoglobin 12.0 (*)    HCT 38.0 (*)    All other components within normal limits  SARS CORONAVIRUS 2 (TAT 6-24 HRS)  BASIC METABOLIC PANEL    EKG EKG Interpretation  Date/Time:  Monday October 25 2020 18:58:50 EDT Ventricular Rate:  83 PR Interval:  162 QRS Duration: 95 QT Interval:  394 QTC Calculation: 463 R Axis:   58 Text Interpretation: Sinus rhythm Sinus pause ST elevation, consider inferior injury Baseline wander Premature ventricular complexes Confirmed by Vanetta Mulders [phone]) on 10/25/2020 7:03:16 PM  Radiology DG Chest 2 View  Result Date: 10/25/2020 CLINICAL DATA:  Shortness of breath EXAM: CHEST - 2 VIEW COMPARISON:  10/09/2020 FINDINGS: Lungs are clear.  No pleural effusion or pneumothorax. The heart is normal in size. Visualized osseous structures are within normal limits. IMPRESSION: Normal chest radiographs. Electronically Signed   By: Charline Bills M.D.   On: 10/25/2020 19:52    Procedures Procedures   Medications Ordered in ED Medications - No data to display  ED Course  I have reviewed the triage vital signs and the nursing notes.  Pertinent labs & imaging results that were available during my care of the patient were reviewed by me and considered in my medical decision making (see chart for details).  Patient was evaluated by me at the bedside in mild distress.  Was having trouble catching his breath between sentences.  Oxygen saturation was at 93.  Will give DuoNeb treatment  Patient was reevaluated by me and reports feeling much better  after his DuoNeb.  Says he is not having any difficulty breathing.  I plan to ambulate him with a pulse ox and if his O2 sats remained stable he may go home and follow-up with cardiology tomorrow and pulmonology Wednesday.   MDM Rules/Calculators/A&P                         Patient is a 60 year old male with a past medical history of COPD who presented today with a complaint of shortness of breath.  He states that this has been ongoing for the past 3 days with a cough.  States that today it does seem to get worse and he was having trouble walking without being winded.  Patient had a negative CTPA 2 weeks ago.  Denies leg swelling, racing heart, chest pain.  Leg surgery over 8 weeks ago.  Low suspicion for DVT/PE.  No tenderness or swelling observed to his lower extremities.  Vital signs remained stable throughout his stay.  Never tachycardic.  Never hypoxic.  Some scattered expiratory wheezes on physical exam.  Basic labs were benign and chest x-ray negative.  EKG similar to prior with no signs of ischemia.  Patient was given DuoNeb treatment and reported feeling "worlds better."  He was ambulated with a pulse ox and his oxygen saturations remained at 93 and above.  Patient reported improvement in his symptoms upon ambulation.  I suspect this was an exacerbation of his COPD.  Patient given 1 dose of Solu-Medrol in the ED.  Reports no feelings of shortness of breath.  Denies chest pain.  Says he would like to go home.  Deemed stable for discharge however I will discharge with 5 days of prednisone.  He will continue with his cardiology and pulmonology appointments this week.  He voices understanding of at home treatment with both prednisone and his baseline COPD medications.  Agreeable to discharge.  Final Clinical Impression(s) /  ED Diagnoses Final diagnoses:  COPD exacerbation (HCC)    Rx / DC Orders Results and diagnoses were explained to the patient. Return precautions discussed in full. Patient  had no additional questions and expressed complete understanding.     Saddie BendersRedwine, Mattheu Brodersen A, PA-C 10/26/20 Danford [id number]    Zackowski, Scott, MD 10/29/20 2303

## 2020-10-25 NOTE — Discharge Instructions (Addendum)
Keep your appointments with cardiology and pulmonology this week. Return if you have intense chest pain, begin to cough up blood or cannot catch your breath.

## 2020-10-25 NOTE — ED Notes (Signed)
States oxygen has helped his dypsnea.

## 2020-10-26 LAB — SARS CORONAVIRUS 2 (TAT 6-24 HRS): SARS Coronavirus 2: NEGATIVE

## 2020-11-15 ENCOUNTER — Encounter (HOSPITAL_BASED_OUTPATIENT_CLINIC_OR_DEPARTMENT_OTHER): Payer: Self-pay

## 2020-11-15 ENCOUNTER — Emergency Department (HOSPITAL_BASED_OUTPATIENT_CLINIC_OR_DEPARTMENT_OTHER)
Admission: EM | Admit: 2020-11-15 | Discharge: 2020-11-15 | Disposition: A | Discharge status: Home / Self Care | Payer: Medicare Other | Attending: Emergency Medicine | Admitting: Emergency Medicine

## 2020-11-15 ENCOUNTER — Emergency Department (HOSPITAL_BASED_OUTPATIENT_CLINIC_OR_DEPARTMENT_OTHER): Payer: Medicare Other

## 2020-11-15 ENCOUNTER — Other Ambulatory Visit: Payer: Self-pay

## 2020-11-15 DIAGNOSIS — G4700 Insomnia, unspecified: Secondary | ICD-10-CM | POA: Diagnosis present

## 2020-11-15 DIAGNOSIS — Z7951 Long term (current) use of inhaled steroids: Secondary | ICD-10-CM | POA: Insufficient documentation

## 2020-11-15 DIAGNOSIS — R519 Headache, unspecified: Secondary | ICD-10-CM | POA: Diagnosis not present

## 2020-11-15 DIAGNOSIS — Z79899 Other long term (current) drug therapy: Secondary | ICD-10-CM | POA: Diagnosis not present

## 2020-11-15 DIAGNOSIS — J449 Chronic obstructive pulmonary disease, unspecified: Secondary | ICD-10-CM | POA: Diagnosis not present

## 2020-11-15 DIAGNOSIS — Z87891 Personal history of nicotine dependence: Secondary | ICD-10-CM | POA: Insufficient documentation

## 2020-11-15 DIAGNOSIS — I10 Essential (primary) hypertension: Secondary | ICD-10-CM | POA: Insufficient documentation

## 2020-11-15 DIAGNOSIS — G479 Sleep disorder, unspecified: Secondary | ICD-10-CM

## 2020-11-15 NOTE — ED Triage Notes (Signed)
Pt arrives with c/o being unable to sleep X1 month reports he has been seen by his primary for this but still unable to sleep. Has been taking rx medications for sleep, but still suffering from insomnia.

## 2020-11-15 NOTE — Discharge Instructions (Addendum)
Follow-up with your psychiatrist/primary care doctor.  I suspect that as you continue to take Ambien your sleep will improve.  Head CT today was unremarkable.

## 2020-11-15 NOTE — ED Notes (Signed)
Patient transported to CT 

## 2020-11-15 NOTE — ED Provider Notes (Signed)
[facility] EMERGENCY DEPARTMENT Provider Note   CSN: [id number] Arrival date & time: 11/15/20  1007     History Chief Complaint  Patient presents with   Insomnia    Shawn Preston is a 60 y.o. male.  Patient has had issues with sleep recently.  Has tried multiple medications and now on Ambien.  Follow with his primary care doctor and psychiatry.  He is concerned that maybe this something going on with his head.  Wants a CT scan.  Denies any weakness or numbness.  The history is provided by the patient.  Insomnia This is a new problem. The current episode started more than 1 week ago. The problem occurs constantly. Associated symptoms include headaches. Pertinent negatives include no chest pain, no abdominal pain and no shortness of breath. Nothing aggravates the symptoms. Nothing relieves the symptoms. He has tried nothing for the symptoms. The treatment provided no relief.      Past Medical History:  Diagnosis Date   COPD (chronic obstructive pulmonary disease) (HCC)    Hypertension    Stroke (cerebrum) (HCC)     There are no problems to display for this patient.   Past Surgical History:  Procedure Laterality Date   HEMORROIDECTOMY     WRIST SURGERY         No family history on file.  Social History   Tobacco Use   Smoking status: Former    Types: Cigarettes   Smokeless tobacco: Never  Vaping Use   Vaping Use: Never used  Substance Use Topics   Alcohol use: No   Drug use: No    Home Medications Prior to Admission medications   Medication Sig Start Date End Date Taking? Authorizing Provider  albuterol (PROVENTIL HFA;VENTOLIN HFA) 108 (90 BASE) MCG/ACT inhaler Inhale into the lungs every 6 (six) hours as needed for wheezing or shortness of breath.    [provider]  amLODipine (NORVASC) 10 MG tablet Take 10 mg by mouth daily.    [provider]  azithromycin (ZITHROMAX) 250 MG tablet Take 1 tablet (250 mg total) by mouth daily.  Take first 2 tablets together, then 1 every day until finished. 03/26/16   Roxy Horseman, PA-C  benzonatate (TESSALON) 100 MG capsule Take 1 capsule (100 mg total) by mouth every 8 (eight) hours. 03/26/16   Roxy Horseman, PA-C  fluticasone-salmeterol (ADVAIR HFA) 230-21 MCG/ACT inhaler Inhale 2 puffs into the lungs 2 (two) times daily.    [provider]  gabapentin (NEURONTIN) 300 MG capsule Take 300 mg by mouth 3 (three) times daily.    [provider]  hydrOXYzine (ATARAX/VISTARIL) 10 MG tablet Take 10 mg by mouth at bedtime as needed.    [provider]  methocarbamol (ROBAXIN) 500 MG tablet Take 1 tablet (500 mg total) by mouth 2 (two) times daily. 02/27/15   Garlon Hatchet, PA-C  naproxen (NAPROSYN) 500 MG tablet Take 1 tablet (500 mg total) by mouth 2 (two) times daily with a meal. 02/27/15   Garlon Hatchet, PA-C  omeprazole (PRILOSEC) 10 MG capsule Take 10 mg by mouth daily.    [provider]  potassium chloride SA (K-DUR,KLOR-CON) 20 MEQ tablet Take 1 tablet (20 mEq total) by mouth daily. 03/23/18   Pricilla Loveless, MD    Allergies    Tramadol and Zoloft [sertraline]  Review of Systems   Review of Systems  Constitutional:  Negative for chills and fever.  HENT:  Negative for ear pain and sore throat.  Eyes:  Negative for pain and visual disturbance.  Respiratory:  Negative for cough and shortness of breath.   Cardiovascular:  Negative for chest pain and palpitations.  Gastrointestinal:  Negative for abdominal pain and vomiting.  Genitourinary:  Negative for dysuria and hematuria.  Musculoskeletal:  Negative for arthralgias and back pain.  Skin:  Negative for color change and rash.  Neurological:  Positive for headaches. Negative for seizures and syncope.  Psychiatric/Behavioral:  The patient has insomnia.   All other systems reviewed and are negative.  Physical Exam Updated Vital Signs BP 127/84 (BP Location: Right Arm)   Pulse 78    Temp 98.6 F (37 C) (Oral)   Resp 18   Ht 6' (1.829 m)   Wt 72.6 kg   SpO2 98%   BMI 21.70 kg/m   Physical Exam Vitals and nursing note reviewed.  Constitutional:      General: He is not in acute distress.    Appearance: He is well-developed. He is not ill-appearing.  HENT:     Head: Normocephalic and atraumatic.     Right Ear: Tympanic membrane normal.     Nose: Nose normal.  Eyes:     Extraocular Movements: Extraocular movements intact.     Conjunctiva/sclera: Conjunctivae normal.     Pupils: Pupils are equal, round, and reactive to light.  Cardiovascular:     Rate and Rhythm: Normal rate and regular rhythm.     Heart sounds: No murmur heard. Pulmonary:     Effort: Pulmonary effort is normal. No respiratory distress.     Breath sounds: Normal breath sounds.  Abdominal:     Palpations: Abdomen is soft.     Tenderness: There is no abdominal tenderness.  Musculoskeletal:     Cervical back: Neck supple.  Skin:    General: Skin is warm and dry.  Neurological:     Mental Status: He is alert and oriented to person, place, and time.     Cranial Nerves: No cranial nerve deficit.     Sensory: No sensory deficit.     Motor: No weakness.     Coordination: Coordination normal.     Comments: 5+ out of 5 strength throughout, normal sensation, normal finger-nose-finger, normal speech, no drift    ED Results / Procedures / Treatments   Labs (all labs ordered are listed, but only abnormal results are displayed) Labs Reviewed - No data to display  EKG None  Radiology CT HEAD WO CONTRAST ( )  Result Date: 11/15/2020 CLINICAL DATA:  Headache, trouble sleeping, insomnia for 3 weeks EXAM: CT HEAD WITHOUT CONTRAST TECHNIQUE: Contiguous axial images were obtained from the base of the skull through the vertex without intravenous contrast. Sagittal and coronal MPR images reconstructed from axial data set. COMPARISON:  06/09/2012 FINDINGS: Brain: Generalized atrophy. 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 Mild ex vacuo dilatation of the RIGHT lateral ventricle. Cavum vergae. Small vessel chronic ischemic changes of deep cerebral white matter. No intracranial hemorrhage, mass lesion or evidence of acute infarction. No extra-axial fluid collections. Vascular: Atherosclerotic calcification of internal carotid arteries at skull base Skull: Intact Sinuses/Orbits: Clear Other: N/A IMPRESSION: Atrophy with small vessel chronic ischemic changes of deep cerebral white matter. 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 No acute intracranial abnormalities. Electronically Signed   By: Ulyses Southward M.D.   On: 11/15/2020 11:30  Procedures Procedures   Medications Ordered in ED Medications - No data to display  ED Course  I have reviewed the triage vital signs and the nursing notes.  Pertinent labs & imaging results that were available during my care of the patient were reviewed by me and considered in my medical decision making (see chart for details).    MDM Rules/Calculators/A&P                           Shawn Preston is here with difficulty sleeping.  Normal vitals.  No fever.  Has had trouble with sleep for the last 3 weeks.  Has been on multiple medications and now is on Ambien for the last several days but not much help.  He is worried about something going on in his head.  Neurologically he is intact.  Head CT showed no acute findings.  Has history of old strokes.  He denies any suicidal homicidal ideation.  No major anxiety.  Overall recommend close follow-up with his psychiatrist/primary care doctor.  He is already on fairly strong medications including Ambien and benzodiazepines.  Do not feel safe making any changes or adding any medications to his sleep regimen at this time.  Discharged in good condition.  Stable throughout  my care.  This chart was dictated using voice recognition software.  Despite best efforts to proofread,  errors can occur which can change the documentation meaning.  Final Clinical Impression(s) / ED Diagnoses Final diagnoses:  Difficulty sleeping    Rx / DC Orders ED Discharge Orders     None        Virgina Norfolk, DO 11/15/20 1137

## 2022-07-15 IMAGING — DX DG CHEST 2V
2 series · 3 of 3 positions shown · non-contrast
Comparison: Radiograph 08/05/2020

CLINICAL DATA: Cough and shortness of breath.

EXAM:
CHEST - 2 VIEW

[Series 2: chest lat · 0.14mm/px · 2 of 2 slices shown]
[im 1/2]
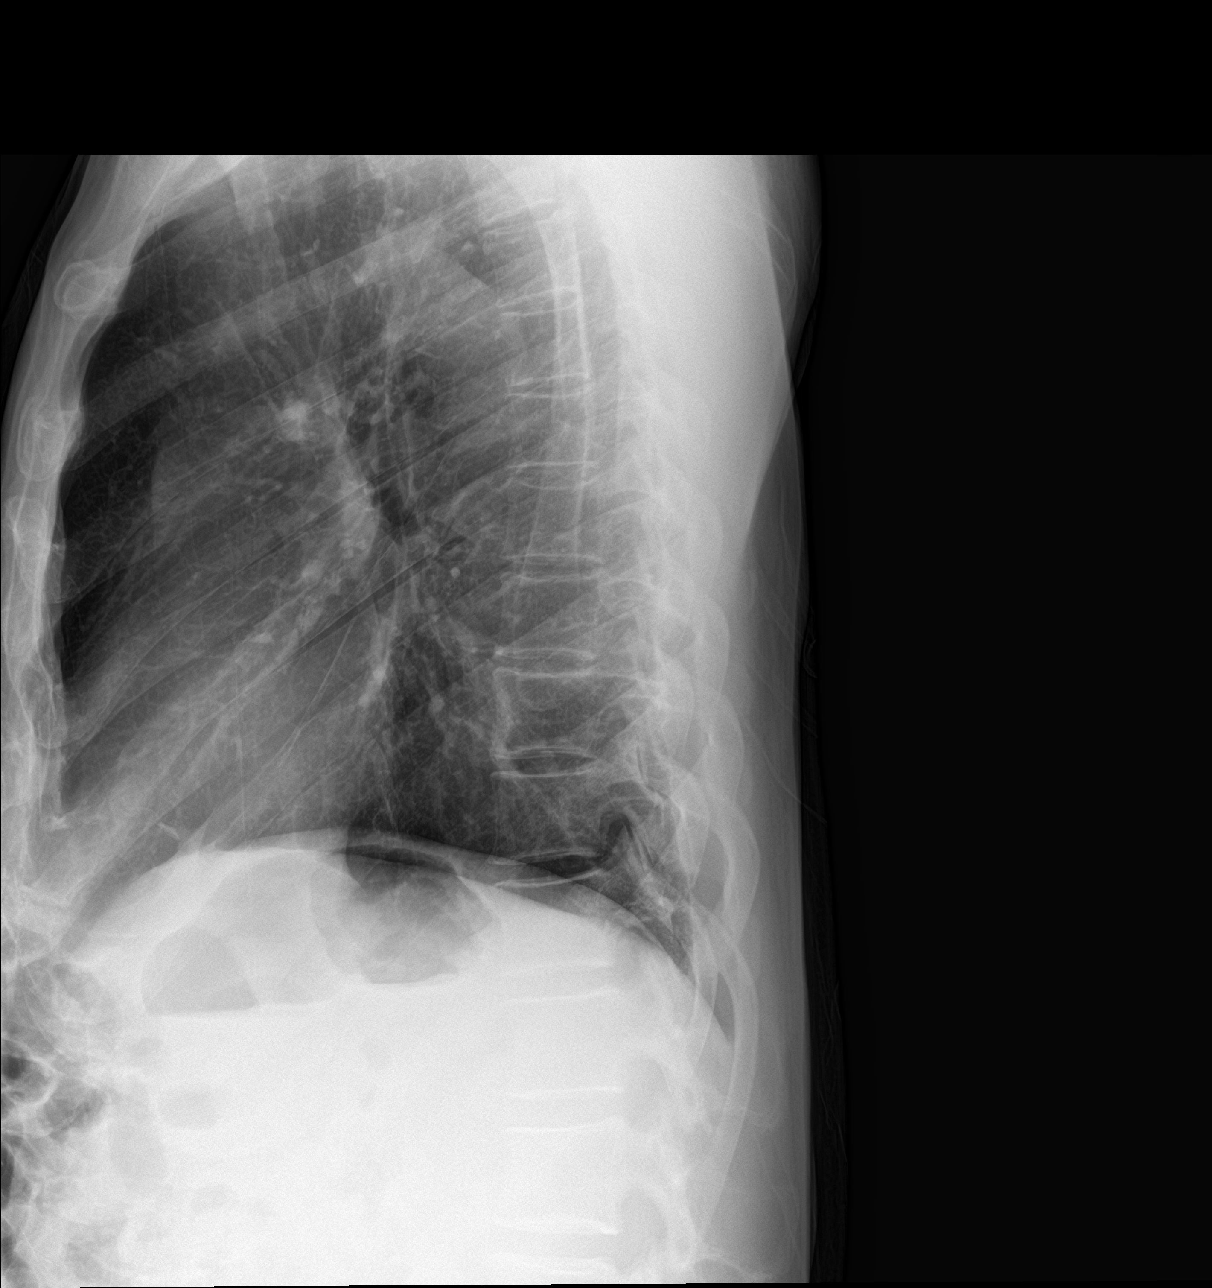
[im 2/2]
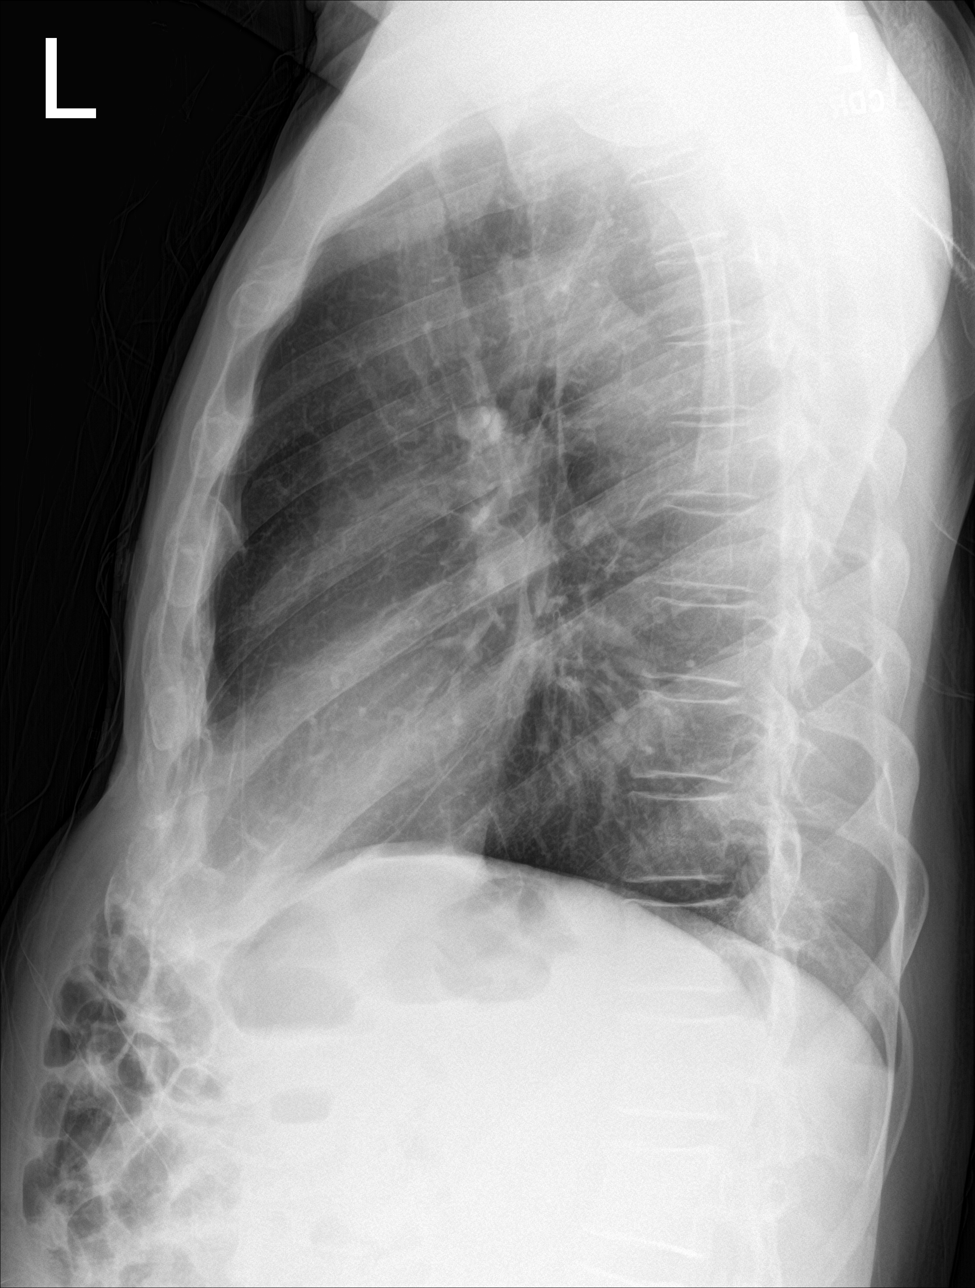

[chest ap]
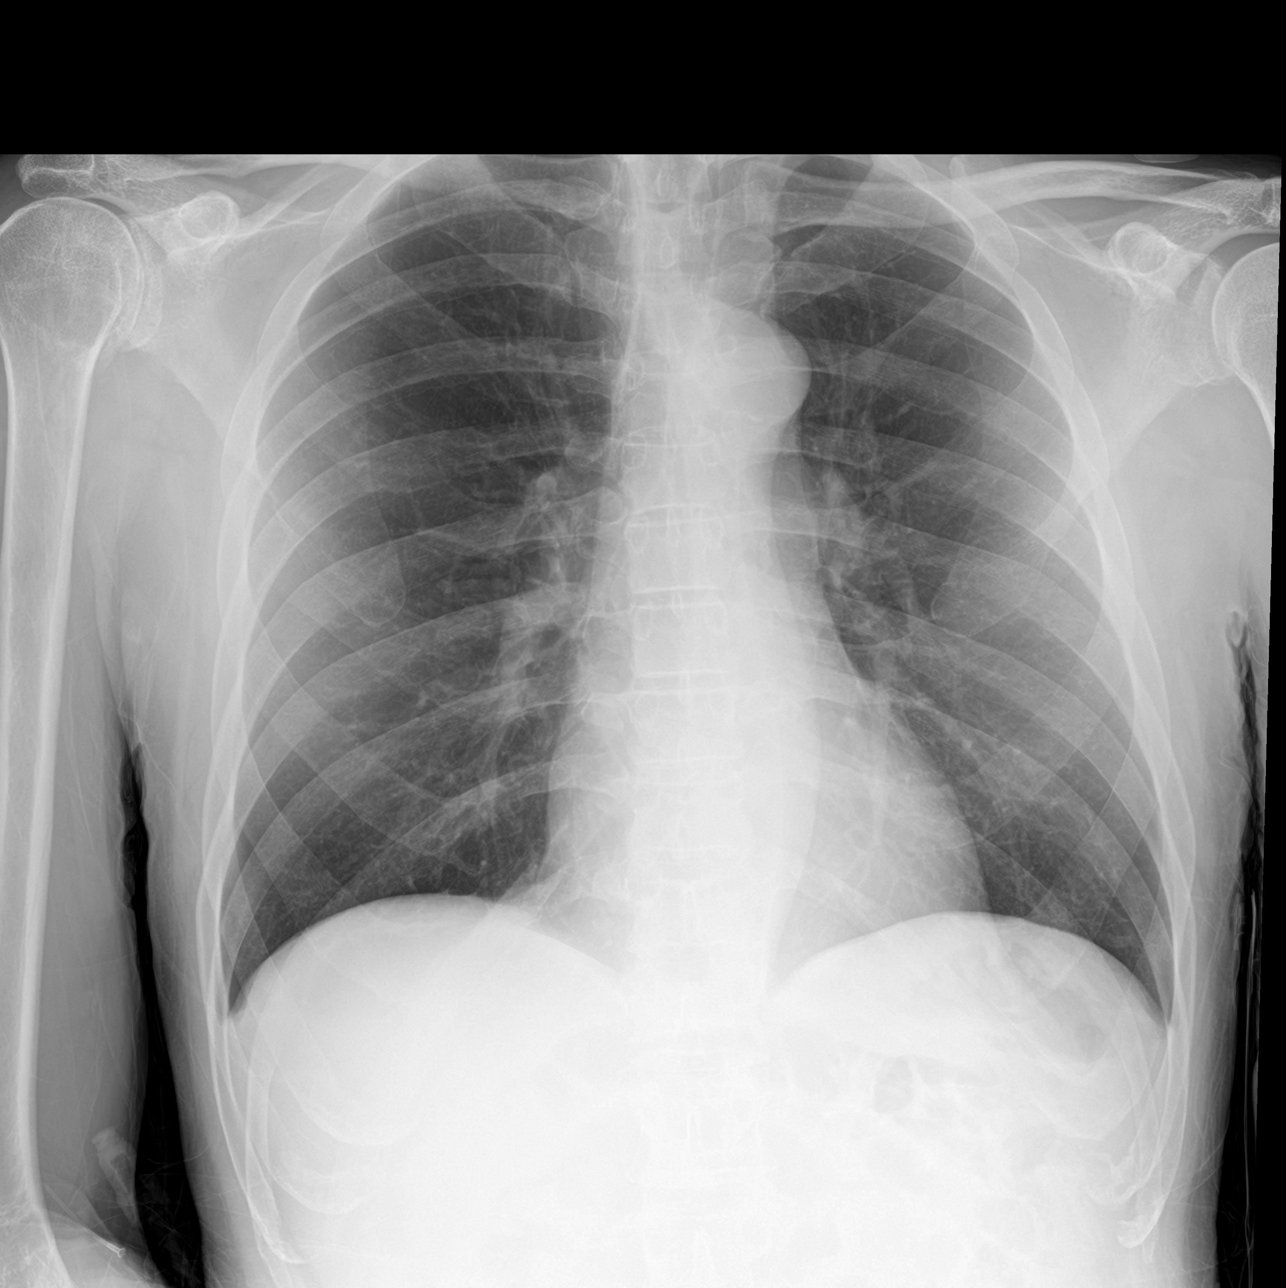

[3 of 3 positions shown; findings below may reference images not displayed]

FINDINGS: The cardiomediastinal contours are normal. The lungs are clear.
Pulmonary vasculature is normal. No consolidation, pleural effusion,
or pneumothorax. No acute osseous abnormalities are seen.
IMPRESSION: No acute chest findings.

## 2022-07-31 IMAGING — DX DG CHEST 2V
2 series · 2 of 2 positions shown · non-contrast
Comparison: 10/09/2020

CLINICAL DATA: Shortness of breath

EXAM:
CHEST - 2 VIEW

[chest pa]
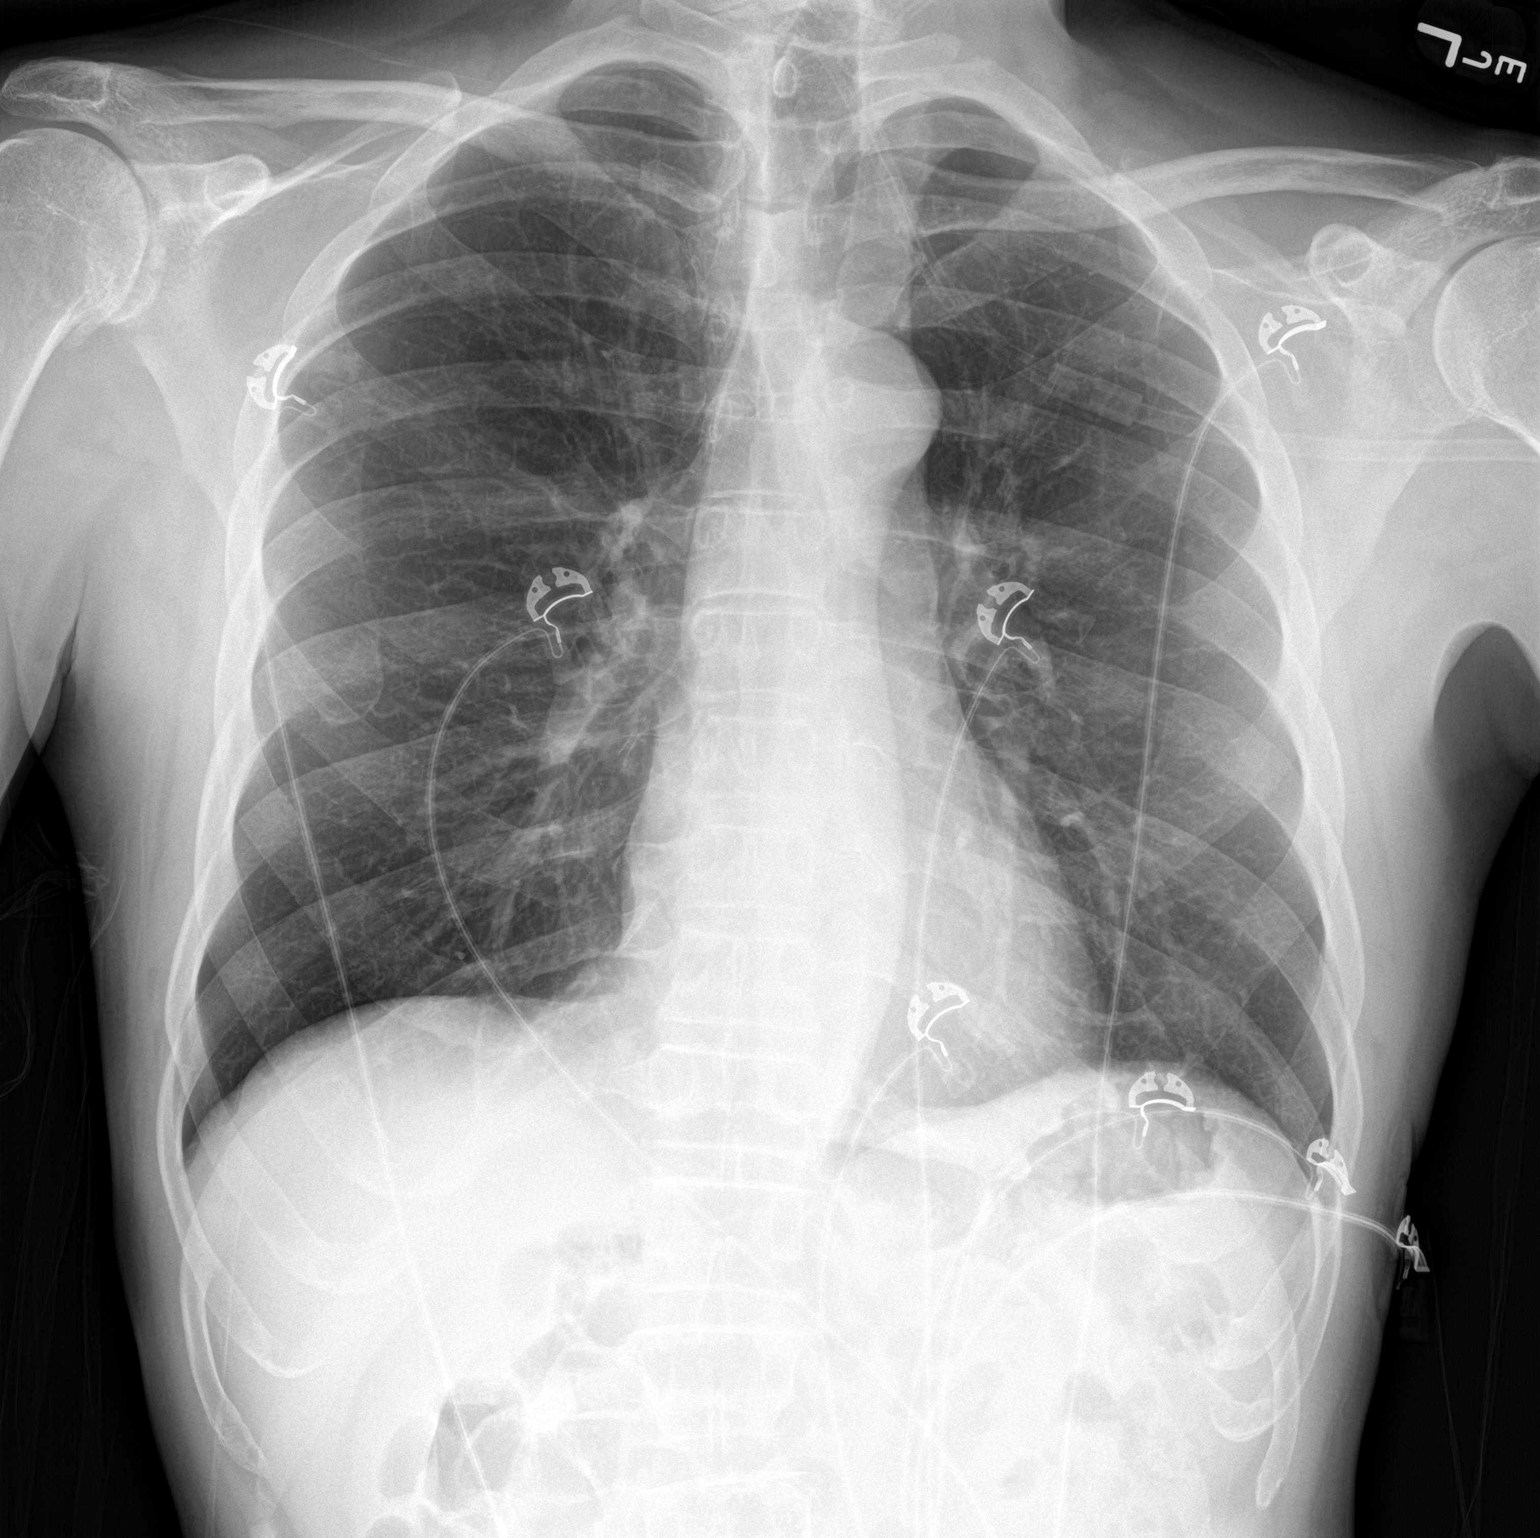

[chest lat]
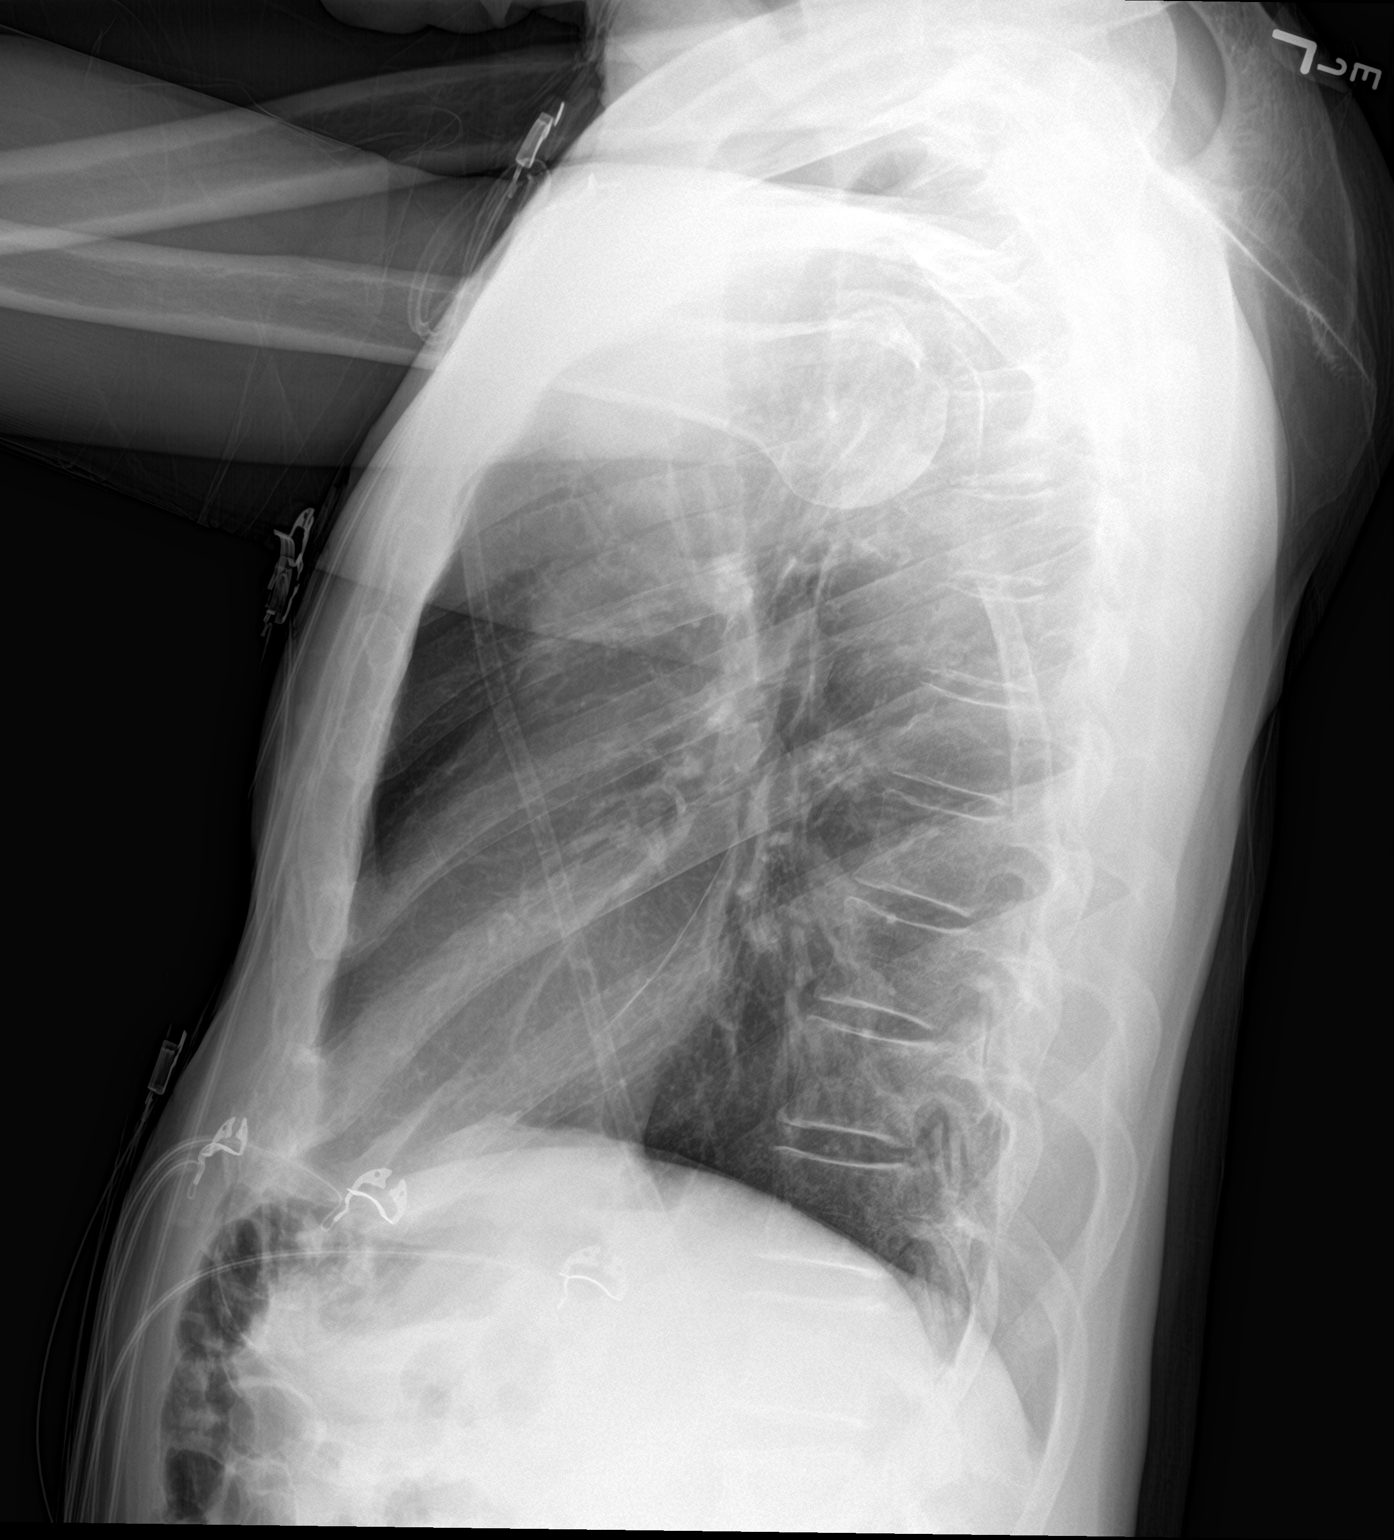

[2 of 2 positions shown; findings below may reference images not displayed]

FINDINGS: Lungs are clear.  No pleural effusion or pneumothorax.

The heart is normal in size.

Visualized osseous structures are within normal limits.
IMPRESSION: Normal chest radiographs.

## 2022-11-12 ENCOUNTER — Emergency Department (HOSPITAL_BASED_OUTPATIENT_CLINIC_OR_DEPARTMENT_OTHER)
Admission: EM | Admit: 2022-11-12 | Discharge: 2022-11-12 | Disposition: A | Discharge status: Home / Self Care | Payer: 59 | Attending: Emergency Medicine | Admitting: Emergency Medicine

## 2022-11-12 ENCOUNTER — Encounter (HOSPITAL_BASED_OUTPATIENT_CLINIC_OR_DEPARTMENT_OTHER): Payer: Self-pay

## 2022-11-12 ENCOUNTER — Other Ambulatory Visit: Payer: Self-pay

## 2022-11-12 DIAGNOSIS — I10 Essential (primary) hypertension: Secondary | ICD-10-CM | POA: Insufficient documentation

## 2022-11-12 DIAGNOSIS — J449 Chronic obstructive pulmonary disease, unspecified: Secondary | ICD-10-CM | POA: Insufficient documentation

## 2022-11-12 DIAGNOSIS — L299 Pruritus, unspecified: Secondary | ICD-10-CM | POA: Diagnosis present

## 2022-11-12 DIAGNOSIS — Z79899 Other long term (current) drug therapy: Secondary | ICD-10-CM | POA: Insufficient documentation

## 2022-11-12 DIAGNOSIS — Z87891 Personal history of nicotine dependence: Secondary | ICD-10-CM | POA: Insufficient documentation

## 2022-11-12 DIAGNOSIS — Z8673 Personal history of transient ischemic attack (TIA), and cerebral infarction without residual deficits: Secondary | ICD-10-CM | POA: Diagnosis not present

## 2022-11-12 MED ORDER — HYDROXYZINE HCL 25 MG PO TABS: 25.0000 mg | ORAL_TABLET | Freq: Four times a day (QID) | ORAL | 0 refills | Status: AC | PRN

## 2022-11-12 NOTE — Discharge Instructions (Addendum)
We evaluated you for your itching.  I am not sure what is causing your itching and I would recommend following up with your primary doctor.  Please keep an eye out for any rashes.  Please return if you have any new or worsening symptoms.  I prescribed you a medication for itching which you can take every 6 hours as needed.  Please be careful as it can make you drowsy.  Do not drive while taking the medication

## 2022-11-12 NOTE — ED Triage Notes (Signed)
The patient stated he is having itching after taking prednisone. He took it 7 days ago and the itching started after two days. He has not had a dose in 5 days.

## 2022-11-12 NOTE — ED Provider Notes (Signed)
[place] [facility]ERGENCY DEPARTMENT AT [facility] Provider Note  CSN: [id number] Arrival date & time: 11/12/22 1102  Chief Complaint(s) Pruritis  HPI Shawn Preston is a 62 y.o. male history of hypertension, COPD presenting with itching.  He reports he was taking prednisone for a musculoskeletal issue.  He developed itching starting around a week and a half ago after starting prednisone and has been off the prednisone for 5 days but has continued to itch.  No rashes.  No new soaps or detergents, pets, environmental exposures, medications otherwise   Past Medical History Past Medical History:  Diagnosis Date   COPD (chronic obstructive pulmonary disease) (HCC)    Hypertension    Stroke (cerebrum) (HCC)    There are no problems to display for this patient.  Home Medication(s) Prior to Admission medications   Medication Sig Start Date End Date Taking? Authorizing Provider  hydrOXYzine (ATARAX) 25 MG tablet Take 1 tablet (25 mg total) by mouth every 6 (six) hours as needed for itching. 11/12/22  Yes Lonell Grandchild, MD  albuterol (PROVENTIL HFA;VENTOLIN HFA) 108 (90 BASE) MCG/ACT inhaler Inhale into the lungs every 6 (six) hours as needed for wheezing or shortness of breath.    [provider]  amLODipine (NORVASC) 10 MG tablet Take 10 mg by mouth daily.    [provider]  azithromycin (ZITHROMAX) 250 MG tablet Take 1 tablet (250 mg total) by mouth daily. Take first 2 tablets together, then 1 every day until finished. 03/26/16   Roxy Horseman, PA-C  benzonatate (TESSALON) 100 MG capsule Take 1 capsule (100 mg total) by mouth every 8 (eight) hours. 03/26/16   Roxy Horseman, PA-C  fluticasone-salmeterol (ADVAIR HFA) 230-21 MCG/ACT inhaler Inhale 2 puffs into the lungs 2 (two) times daily.    [provider]  gabapentin (NEURONTIN) 300 MG capsule Take 300 mg by mouth 3 (three) times daily.    [provider]  methocarbamol (ROBAXIN) 500 MG  tablet Take 1 tablet (500 mg total) by mouth 2 (two) times daily. 02/27/15   Garlon Hatchet, PA-C  naproxen (NAPROSYN) 500 MG tablet Take 1 tablet (500 mg total) by mouth 2 (two) times daily with a meal. 02/27/15   Garlon Hatchet, PA-C  omeprazole (PRILOSEC) 10 MG capsule Take 10 mg by mouth daily.    [provider]  potassium chloride SA (K-DUR,KLOR-CON) 20 MEQ tablet Take 1 tablet (20 mEq total) by mouth daily. 03/23/18   Pricilla Loveless, MD                                                                                                                                    Past Surgical History Past Surgical History:  Procedure Laterality Date   HEMORROIDECTOMY     WRIST SURGERY     Family History History reviewed. No pertinent family history.  Social History Social History   Tobacco Use   Smoking  status: Former    Types: Cigarettes   Smokeless tobacco: Never  Vaping Use   Vaping status: Never Used  Substance Use Topics   Alcohol use: No   Drug use: No   Allergies Tramadol and Zoloft [sertraline]  Review of Systems Review of Systems  All other systems reviewed and are negative.   Physical Exam Vital Signs  I have reviewed the triage vital signs BP 130/74   Pulse 60   Temp 97.7 F (36.5 C) (Oral)   Resp 16   Ht 6' (1.829 m)   Wt 72 kg   SpO2 97%   BMI 21.53 kg/m  Physical Exam Vitals and nursing note reviewed.  Constitutional:      General: He is not in acute distress.    Appearance: Normal appearance.  HENT:     Head: Normocephalic and atraumatic.     Mouth/Throat:     Mouth: Mucous membranes are moist.  Eyes:     General: No scleral icterus.    Conjunctiva/sclera: Conjunctivae normal.  Cardiovascular:     Rate and Rhythm: Normal rate.  Pulmonary:     Effort: Pulmonary effort is normal. No respiratory distress.  Abdominal:     General: Abdomen is flat.  Skin:    General: Skin is warm and dry.     Capillary Refill: Capillary refill takes  less than 2 seconds.     Coloration: Skin is not jaundiced.     Findings: No rash.  Neurological:     General: No focal deficit present.     Mental Status: He is alert. Mental status is at baseline.  Psychiatric:        Mood and Affect: Mood normal.        Behavior: Behavior normal.     ED Results and Treatments Labs (all labs ordered are listed, but only abnormal results are displayed) Labs Reviewed - No data to display                                                                                                                        Radiology No results found.  Pertinent labs & imaging results that were available during my care of the patient were reviewed by me and considered in my medical decision making (see MDM for details).  Medications Ordered in ED Medications - No data to display  Procedures Procedures  (including critical care time)  Medical Decision Making / ED Course   MDM:  62 year old male with itching.  Unclear cause of itching.  Patient has no rash.  Could potentially be medication reaction from prednisone.  No other new environmental exposures per patient.  Patient not jaundice so doubt any biliary pathology.  Patient has primary doctor follow-up.  Will try hydroxyzine. Will discharge patient to home. All questions answered. Patient comfortable with plan of discharge. Return precautions discussed with patient and specified on the after visit summary.        Medicines ordered and prescription drug management: Meds ordered this encounter  Medications   hydrOXYzine (ATARAX) 25 MG tablet    Sig: Take 1 tablet (25 mg total) by mouth every 6 (six) hours as needed for itching.    Dispense:  20 tablet    Refill:  0    -I have reviewed the patients home medicines and have made adjustments as needed    Co morbidities that  complicate the patient evaluation  Past Medical History:  Diagnosis Date   COPD (chronic obstructive pulmonary disease) (HCC)    Hypertension    Stroke (cerebrum) (HCC)       Dispostion: Disposition decision including need for hospitalization was considered, and patient discharged from emergency department.    Final Clinical Impression(s) / ED Diagnoses Final diagnoses:  Itching     This chart was dictated using voice recognition software.  Despite best efforts to proofread,  errors can occur which can change the documentation meaning.    Lonell Grandchild, MD 11/12/22 [phone]

## 2023-04-06 ENCOUNTER — Emergency Department (HOSPITAL_BASED_OUTPATIENT_CLINIC_OR_DEPARTMENT_OTHER)
Admission: EM | Admit: 2023-04-06 | Discharge: 2023-04-06 | Disposition: A | Discharge status: Home / Self Care | Payer: 59 | Attending: Emergency Medicine | Admitting: Emergency Medicine

## 2023-04-06 ENCOUNTER — Emergency Department (HOSPITAL_BASED_OUTPATIENT_CLINIC_OR_DEPARTMENT_OTHER): Payer: 59

## 2023-04-06 ENCOUNTER — Other Ambulatory Visit: Payer: Self-pay

## 2023-04-06 ENCOUNTER — Encounter (HOSPITAL_BASED_OUTPATIENT_CLINIC_OR_DEPARTMENT_OTHER): Payer: Self-pay | Admitting: Emergency Medicine

## 2023-04-06 DIAGNOSIS — R1033 Periumbilical pain: Secondary | ICD-10-CM | POA: Diagnosis present

## 2023-04-06 DIAGNOSIS — R1084 Generalized abdominal pain: Secondary | ICD-10-CM | POA: Insufficient documentation

## 2023-04-06 LAB — CBC
HCT: 42.4 % (ref 39.0–52.0)
Hemoglobin: 13.7 g/dL (ref 13.0–17.0)
MCH: 28.8 pg (ref 26.0–34.0)
MCHC: 32.3 g/dL (ref 30.0–36.0)
MCV: 89.3 fL (ref 80.0–100.0)
Platelets: 198 10*3/uL (ref 150–400)
RBC: 4.75 MIL/uL (ref 4.22–5.81)
RDW: 12.3 % (ref 11.5–15.5)
WBC: 5.4 10*3/uL (ref 4.0–10.5)
nRBC: 0 % (ref 0.0–0.2)

## 2023-04-06 LAB — COMPREHENSIVE METABOLIC PANEL
ALT: 32 U/L (ref 0–44)
AST: 31 U/L (ref 15–41)
Albumin: 4.2 g/dL (ref 3.5–5.0)
Alkaline Phosphatase: 46 U/L (ref 38–126)
Anion gap: 5 (ref 5–15)
BUN: 11 mg/dL (ref 8–23)
CO2: 24 mmol/L (ref 22–32)
Calcium: 9.1 mg/dL (ref 8.9–10.3)
Chloride: 106 mmol/L (ref 98–111)
Creatinine, Ser: 1.03 mg/dL (ref 0.61–1.24)
GFR, Estimated: >60 mL/min (ref >60)
Glucose, Bld: 116 mg/dL — ABNORMAL HIGH (ref 70–99)
Potassium: 4.3 mmol/L (ref 3.5–5.1)
Sodium: 135 mmol/L (ref 135–145)
Total Bilirubin: 1.2 mg/dL (ref 0.0–1.2)
Total Protein: 6.9 g/dL (ref 6.5–8.1)

## 2023-04-06 LAB — URINALYSIS, ROUTINE W REFLEX MICROSCOPIC
Bilirubin Urine: NEGATIVE
Glucose, UA: NEGATIVE mg/dL
Hgb urine dipstick: NEGATIVE
Ketones, ur: NEGATIVE mg/dL
Leukocytes,Ua: NEGATIVE
Nitrite: NEGATIVE
Protein, ur: NEGATIVE mg/dL
Specific Gravity, Urine: <=1.005 (ref 1.005–1.030)
pH: 6 (ref 5.0–8.0)

## 2023-04-06 LAB — LIPASE, BLOOD: Lipase: 49 U/L (ref 11–51)

## 2023-04-06 MED ORDER — METOCLOPRAMIDE HCL 10 MG PO TABS
10.0000 mg | ORAL_TABLET | Freq: Four times a day (QID) | ORAL | 0 refills | Status: AC
Start: 2023-04-06 — End: ?

## 2023-04-06 MED ORDER — IOHEXOL 300 MG/ML  SOLN
75.0000 mL | Freq: Once | INTRAMUSCULAR | Status: AC | PRN
  Administered 2023-04-06: 75 mL via INTRAVENOUS

## 2023-04-06 MED ORDER — METOCLOPRAMIDE HCL 5 MG/ML IJ SOLN
5.0000 mg | Freq: Once | INTRAMUSCULAR | Status: AC
  Administered 2023-04-06: 5 mg via INTRAVENOUS
  Filled 2023-04-06: qty 2

## 2023-04-06 NOTE — Discharge Instructions (Signed)
While you are in the emergency room, you had CT imaging done that was negative.  Your blood work was also normal.  You received a medication here called Reglan, which can sometimes help with the abdominal discomfort you are describing.  You can take 10 mg of Reglan 2 times per day as needed for abdominal pain.  Please follow-up with your GI doctor.

## 2023-04-06 NOTE — ED Provider Notes (Signed)
[place] [facility] AT [facility] Provider Note   CSN: [id number] Arrival date & time: 04/06/23  1105     History  Chief Complaint  Patient presents with   Abdominal Pain    Shawn Preston is a 63 y.o. male.  HPI 63 year old male presents with abdominal pain.  He has been having worse than typical abdominal pain for about 3 days.  The pain is periumbilical.  He has been dealing with abdominal pain for years and sees a gastroenterologist with [facility].  However typically he will have a morning routine where he will take medicines including Bentyl, eat, have a bowel movement, and the abdominal pain will go away.  However over the last 3 days it has not and seems to be getting worse.  It is a cramping pain that is intermittent.  No fevers, cough, chest pain, shortness of breath, back pain, urinary symptoms.  He had 2 bowel movements this morning.  No vomiting.  The pain is about a 7 out of 10.  Home Medications Prior to Admission medications   Medication Sig Start Date End Date Taking? Authorizing Provider  albuterol (PROVENTIL HFA;VENTOLIN HFA) 108 (90 BASE) MCG/ACT inhaler Inhale into the lungs every 6 (six) hours as needed for wheezing or shortness of breath.    [provider]  amLODipine (NORVASC) 10 MG tablet Take 10 mg by mouth daily.    [provider]  azithromycin (ZITHROMAX) 250 MG tablet Take 1 tablet (250 mg total) by mouth daily. Take first 2 tablets together, then 1 every day until finished. 03/26/16   Roxy Horseman, PA-C  benzonatate (TESSALON) 100 MG capsule Take 1 capsule (100 mg total) by mouth every 8 (eight) hours. 03/26/16   Roxy Horseman, PA-C  fluticasone-salmeterol (ADVAIR HFA) 230-21 MCG/ACT inhaler Inhale 2 puffs into the lungs 2 (two) times daily.    [provider]  gabapentin (NEURONTIN) 300 MG capsule Take 300 mg by mouth 3 (three) times daily.    [provider]  hydrOXYzine (ATARAX)  25 MG tablet Take 1 tablet (25 mg total) by mouth every 6 (six) hours as needed for itching. 11/12/22   Lonell Grandchild, MD  methocarbamol (ROBAXIN) 500 MG tablet Take 1 tablet (500 mg total) by mouth 2 (two) times daily. 02/27/15   Garlon Hatchet, PA-C  naproxen (NAPROSYN) 500 MG tablet Take 1 tablet (500 mg total) by mouth 2 (two) times daily with a meal. 02/27/15   Garlon Hatchet, PA-C  omeprazole (PRILOSEC) 10 MG capsule Take 10 mg by mouth daily.    [provider]  potassium chloride SA (K-DUR,KLOR-CON) 20 MEQ tablet Take 1 tablet (20 mEq total) by mouth daily. 03/23/18   Pricilla Loveless, MD      Allergies    Tramadol and Zoloft [sertraline]    Review of Systems   Review of Systems  Respiratory:  Negative for shortness of breath.   Cardiovascular:  Negative for chest pain.  Gastrointestinal:  Positive for abdominal pain. Negative for diarrhea and vomiting.    Physical Exam Updated Vital Signs BP 119/73   Pulse 71   Temp 98.3 F (36.8 C)   Resp 15   Ht 6' (1.829 m)   Wt 74.8 kg   SpO2 96%   BMI 22.38 kg/m  Physical Exam Vitals and nursing note reviewed.  Constitutional:      General: He is not in acute distress.    Appearance: He is well-developed. He is not  ill-appearing or diaphoretic.  HENT:     Head: Normocephalic and atraumatic.  Cardiovascular:     Rate and Rhythm: Normal rate and regular rhythm.     Heart sounds: Normal heart sounds.  Pulmonary:     Effort: Pulmonary effort is normal.     Breath sounds: Normal breath sounds.  Abdominal:     Palpations: Abdomen is soft.     Tenderness: There is abdominal tenderness in the periumbilical area and left upper quadrant.  Skin:    General: Skin is warm and dry.  Neurological:     Mental Status: He is alert.     ED Results / Procedures / Treatments   Labs (all labs ordered are listed, but only abnormal results are displayed) Labs Reviewed  COMPREHENSIVE METABOLIC PANEL - Abnormal; Notable for  the following components:      Result Value   Glucose, Bld 116 (*)    All other components within normal limits  LIPASE, BLOOD  CBC  URINALYSIS, ROUTINE W REFLEX MICROSCOPIC    EKG EKG Interpretation Date/Time:  Friday April 06 2023 14:25:22 EST Ventricular Rate:  65 PR Interval:  184 QRS Duration:  104 QT Interval:  392 QTC Calculation: 408 R Axis:   4  Text Interpretation: Sinus rhythm Probable left atrial enlargement RSR' in V1 or V2, right VCD or RVH  no significant change since Aug 2022 Confirmed by Pricilla Loveless [phone]) on 04/06/2023 2:30:34 PM  Radiology No results found.  Procedures Procedures    Medications Ordered in ED Medications - No data to display  ED Course/ Medical Decision Making/ A&P                                 Medical Decision Making Amount and/or Complexity of Data Reviewed Labs: ordered.    Details: Normal WBC.  Normal lipase. Radiology: ordered. ECG/medicine tests: ordered and independent interpretation performed.    Details: No acute ischemia   Patient seems to have chronic abdominal discomfort mostly in the mornings but is having more consistent and severe pain over the last few days.  Originally declined pain medicine and now the pain is starting to improve.  Unclear source and given his age and prior history such as stroke we will get a CT of the abdomen and pelvis. Care transferred to [facility].        Final Clinical Impression(s) / ED Diagnoses Final diagnoses:  None    Rx / DC Orders ED Discharge Orders     None         Pricilla Loveless, MD 04/06/23 1459

## 2023-04-06 NOTE — ED Provider Notes (Signed)
  Physical Exam  BP 125/75   Pulse 65   Temp 98.3 F (36.8 C)   Resp 15   Ht 6' (1.829 m)   Wt 74.8 kg   SpO2 99%   BMI 22.38 kg/m   Physical Exam  Procedures  Procedures  ED Course / MDM    Medical Decision Making Amount and/or Complexity of Data Reviewed Labs: ordered. Radiology: ordered.  Risk Prescription drug management.  Wardell Honour, assumed care for this patient.  In brief this is a 63 year old male here today for acute on chronic abdominal pain.  Patient was signed out pending labs and reassessment, imaging.  Patient's imaging negative.  Labs overall reassuring.  His abdomen is soft, nondistended.  Believe this is likely his chronic abdominal pain.  Did provide patient a little bit of Reglan here which did seem to help him.  Will discharge him with a prescription.  He is following up with his specialist in the next couple of weeks.       Anders Simmonds T, DO 04/06/23 1643

## 2023-04-06 NOTE — ED Triage Notes (Signed)
Pt has chronic abdominal pain.  Takes bentyl which helps but pain returned after eating.  No N/V/D.  No fever.  Last BM today

## 2023-04-06 NOTE — ED Notes (Signed)
Pt transported to imaging.

## 2023-04-06 NOTE — ED Notes (Signed)
Reviewed discharge instructions follow up and medications with pt. Pt states understanding. Ambulatory at discharge

## 2023-10-14 ENCOUNTER — Other Ambulatory Visit: Payer: Self-pay

## 2023-10-14 ENCOUNTER — Telehealth (HOSPITAL_BASED_OUTPATIENT_CLINIC_OR_DEPARTMENT_OTHER): Payer: Self-pay | Admitting: Emergency Medicine

## 2023-10-14 ENCOUNTER — Emergency Department (HOSPITAL_BASED_OUTPATIENT_CLINIC_OR_DEPARTMENT_OTHER)
Admission: EM | Admit: 2023-10-14 | Discharge: 2023-10-14 | Disposition: A | Discharge status: Home / Self Care | Attending: Emergency Medicine | Admitting: Emergency Medicine

## 2023-10-14 ENCOUNTER — Emergency Department (HOSPITAL_BASED_OUTPATIENT_CLINIC_OR_DEPARTMENT_OTHER)

## 2023-10-14 ENCOUNTER — Encounter (HOSPITAL_BASED_OUTPATIENT_CLINIC_OR_DEPARTMENT_OTHER): Payer: Self-pay

## 2023-10-14 DIAGNOSIS — M545 Low back pain, unspecified: Secondary | ICD-10-CM | POA: Diagnosis not present

## 2023-10-14 DIAGNOSIS — J449 Chronic obstructive pulmonary disease, unspecified: Secondary | ICD-10-CM | POA: Insufficient documentation

## 2023-10-14 DIAGNOSIS — R102 Pelvic and perineal pain: Secondary | ICD-10-CM | POA: Diagnosis present

## 2023-10-14 DIAGNOSIS — Z79899 Other long term (current) drug therapy: Secondary | ICD-10-CM | POA: Insufficient documentation

## 2023-10-14 DIAGNOSIS — R300 Dysuria: Secondary | ICD-10-CM | POA: Insufficient documentation

## 2023-10-14 DIAGNOSIS — I10 Essential (primary) hypertension: Secondary | ICD-10-CM | POA: Diagnosis not present

## 2023-10-14 LAB — URINALYSIS, W/ REFLEX TO CULTURE (INFECTION SUSPECTED)
Bilirubin Urine: NEGATIVE
Glucose, UA: NEGATIVE mg/dL
Ketones, ur: NEGATIVE mg/dL
Leukocytes,Ua: NEGATIVE
Nitrite: NEGATIVE
Protein, ur: NEGATIVE mg/dL
Specific Gravity, Urine: 1.01 (ref 1.005–1.030)
Squamous Epithelial / HPF: NONE SEEN /HPF (ref 0–5)
pH: 6 (ref 5.0–8.0)

## 2023-10-14 LAB — BASIC METABOLIC PANEL WITH GFR
Anion gap: 12 (ref 5–15)
BUN: 8 mg/dL (ref 8–23)
CO2: 23 mmol/L (ref 22–32)
Calcium: 9.3 mg/dL (ref 8.9–10.3)
Chloride: 104 mmol/L (ref 98–111)
Creatinine, Ser: 0.98 mg/dL (ref 0.61–1.24)
GFR, Estimated: >60 mL/min (ref >60)
Glucose, Bld: 123 mg/dL — ABNORMAL HIGH (ref 70–99)
Potassium: 3.8 mmol/L (ref 3.5–5.1)
Sodium: 139 mmol/L (ref 135–145)

## 2023-10-14 LAB — CBC WITH DIFFERENTIAL/PLATELET
Abs Immature Granulocytes: 0.01 K/uL (ref 0.00–0.07)
Basophils Absolute: 0 K/uL (ref 0.0–0.1)
Basophils Relative: 0 %
Eosinophils Absolute: 0 K/uL (ref 0.0–0.5)
Eosinophils Relative: 1 %
HCT: 39.8 % (ref 39.0–52.0)
Hemoglobin: 12.9 g/dL — ABNORMAL LOW (ref 13.0–17.0)
Immature Granulocytes: 0 %
Lymphocytes Relative: 24 %
Lymphs Abs: 1.4 K/uL (ref 0.7–4.0)
MCH: 28.8 pg (ref 26.0–34.0)
MCHC: 32.4 g/dL (ref 30.0–36.0)
MCV: 88.8 fL (ref 80.0–100.0)
Monocytes Absolute: 0.8 K/uL (ref 0.1–1.0)
Monocytes Relative: 14 %
Neutro Abs: 3.4 K/uL (ref 1.7–7.7)
Neutrophils Relative %: 61 %
Platelets: 240 K/uL (ref 150–400)
RBC: 4.48 MIL/uL (ref 4.22–5.81)
RDW: 12 % (ref 11.5–15.5)
WBC: 5.6 K/uL (ref 4.0–10.5)
nRBC: 0 % (ref 0.0–0.2)

## 2023-10-14 MED ORDER — OXYCODONE-ACETAMINOPHEN 5-325 MG PO TABS: 1.0000 | ORAL_TABLET | Freq: Four times a day (QID) | ORAL | 0 refills | Status: AC | PRN

## 2023-10-14 MED ORDER — OXYCODONE-ACETAMINOPHEN 5-325 MG PO TABS: 1.0000 | ORAL_TABLET | Freq: Four times a day (QID) | ORAL | 0 refills | Status: DC | PRN

## 2023-10-14 MED ORDER — IOHEXOL 300 MG/ML  SOLN
100.0000 mL | Freq: Once | INTRAMUSCULAR | Status: AC | PRN
  Administered 2023-10-14: 100 mL via INTRAVENOUS

## 2023-10-14 NOTE — ED Triage Notes (Signed)
 Pt reports recent prostate biopsy and developed UTI from procedure. Pt has been taking levaquin for 7 days. Burning had improved, now it has increased. Lower back pain as well

## 2023-10-14 NOTE — Discharge Instructions (Addendum)
 The pain potentially could be coming coming a prostate.  The CAT scan was reassuring.  Follow-up with your urologist and the oncologist as planned.  I do not think we need new antibiotics at this time.

## 2023-10-14 NOTE — ED Provider Notes (Signed)
 [place] [facility] Provider Note   CSN: [id number] Arrival date & time: 10/14/23  1004     Patient presents with: Dysuria   Shawn Preston is a 63 y.o. male.  {Add pertinent medical, surgical, social history, OB history to YEP:67052}  Dysuria Presenting symptoms: dysuria   Patient with prostate biopsy 3 to 4 weeks ago.  Had been complicated by pain.  Had been seen by PCP who started him on Levaquin for 7 days.  Took last dose this morning.  Now increasing pain.  More dysuria.  Also pain in the low back.  Plan to follow-up with oncology.  No fevers.  Feels as if he has to be urinating more frequently.    Past Medical History:  Diagnosis Date   COPD (chronic obstructive pulmonary disease) (HCC)    Hypertension    Stroke (cerebrum) (HCC)     Prior to Admission medications   Medication Sig Start Date End Date Taking? Authorizing Provider  albuterol  (PROVENTIL  HFA;VENTOLIN  HFA) 108 (90 BASE) MCG/ACT inhaler Inhale into the lungs every 6 (six) hours as needed for wheezing or shortness of breath.    [provider]  amLODipine (NORVASC) 10 MG tablet Take 10 mg by mouth daily.    [provider]  azithromycin  (ZITHROMAX ) 250 MG tablet Take 1 tablet (250 mg total) by mouth daily. Take first 2 tablets together, then 1 every day until finished. 03/26/16   Vicky Charleston, PA-C  benzonatate  (TESSALON ) 100 MG capsule Take 1 capsule (100 mg total) by mouth every 8 (eight) hours. 03/26/16   Vicky Charleston, PA-C  fluticasone-salmeterol (ADVAIR HFA) 230-21 MCG/ACT inhaler Inhale 2 puffs into the lungs 2 (two) times daily.    [provider]  gabapentin (NEURONTIN) 300 MG capsule Take 300 mg by mouth 3 (three) times daily.    [provider]  hydrOXYzine  (ATARAX ) 25 MG tablet Take 1 tablet (25 mg total) by mouth every 6 (six) hours as needed for itching. 11/12/22   Francesca Elsie CROME, MD  methocarbamol  (ROBAXIN ) 500 MG tablet  Take 1 tablet (500 mg total) by mouth 2 (two) times daily. 02/27/15   Jarold Olam HERO, PA-C  metoCLOPramide  (REGLAN ) 10 MG tablet Take 1 tablet (10 mg total) by mouth every 6 (six) hours. 04/06/23   Mannie Fairy DASEN, DO  naproxen  (NAPROSYN ) 500 MG tablet Take 1 tablet (500 mg total) by mouth 2 (two) times daily with a meal. 02/27/15   Jarold Olam HERO, PA-C  omeprazole (PRILOSEC) 10 MG capsule Take 10 mg by mouth daily.    [provider]  potassium chloride  SA (K-DUR,KLOR-CON ) 20 MEQ tablet Take 1 tablet (20 mEq total) by mouth daily. 03/23/18   Freddi Hamilton, MD    Allergies: Tramadol and Zoloft [sertraline]    Review of Systems  Genitourinary:  Positive for dysuria.    Updated Vital Signs BP (!) 150/88   Pulse (!) 104   Temp 98 F (36.7 C)   Resp 16   Wt 81.6 kg   SpO2 97%   BMI 24.41 kg/m   Physical Exam Vitals and nursing note reviewed.  Abdominal:     Comments: Mild suprapubic tenderness.  No mass.  Genitourinary:    Comments: Mild perineal tenderness. Skin:    General: Skin is warm.     Capillary Refill: Capillary refill takes less than 2 seconds.  Neurological:     Mental Status: He is alert.     (all labs ordered are  listed, but only abnormal results are displayed) Labs Reviewed  URINALYSIS, W/ REFLEX TO CULTURE (INFECTION SUSPECTED)  BASIC METABOLIC PANEL WITH GFR  CBC WITH DIFFERENTIAL/PLATELET    EKG: None  Radiology: No results found.  {Document cardiac monitor, telemetry assessment procedure when appropriate:32947} Procedures   Medications Ordered in the ED - No data to display    {Click here for ABCD2, HEART and other calculators REFRESH Note before signing:1}                              Medical Decision Making Amount and/or Complexity of Data Reviewed Labs: ordered. Radiology: ordered.   Patient with dysuria pelvic and low back pain.  Recent prostate biopsy.  Differential diagnosis includes postoperative pain, but also causes  such as UTI, prostatitis, metastatic disease, hemorrhage.  Will plan blood work urinalysis postvoid residual and CT scan.  {Document critical care time when appropriate  Document review of labs and clinical decision tools ie CHADS2VASC2, etc  Document your independent review of radiology images and any outside records  Document your discussion with family members, caretakers and with consultants  Document social determinants of health affecting pt's care  Document your decision making why or why not admission, treatments were needed:32947:::1}   Final diagnoses:  None    ED Discharge Orders     None

## 2023-10-14 NOTE — ED Notes (Signed)
 Post-void residual completed per provider's order. 105 ml measured by bladder scanner. Provider notified.

## 2023-10-14 NOTE — Telephone Encounter (Signed)
 No percocet at [facility]. Called to cancel and sent the previously rx to [facility].

## 2023-10-15 ENCOUNTER — Other Ambulatory Visit: Payer: Self-pay

## 2023-10-15 DIAGNOSIS — N50811 Right testicular pain: Secondary | ICD-10-CM | POA: Diagnosis present

## 2023-10-15 DIAGNOSIS — Z79899 Other long term (current) drug therapy: Secondary | ICD-10-CM | POA: Insufficient documentation

## 2023-10-15 DIAGNOSIS — N451 Epididymitis: Secondary | ICD-10-CM | POA: Insufficient documentation

## 2023-10-15 NOTE — ED Triage Notes (Signed)
 Pt reports RT testicle swelling and pain that started today, denies any urinary changes

## 2023-10-16 ENCOUNTER — Encounter (HOSPITAL_BASED_OUTPATIENT_CLINIC_OR_DEPARTMENT_OTHER): Payer: Self-pay | Admitting: Emergency Medicine

## 2023-10-16 ENCOUNTER — Emergency Department (HOSPITAL_BASED_OUTPATIENT_CLINIC_OR_DEPARTMENT_OTHER)
Admission: EM | Admit: 2023-10-16 | Discharge: 2023-10-16 | Disposition: A | Discharge status: Home / Self Care | Attending: Emergency Medicine | Admitting: Emergency Medicine

## 2023-10-16 ENCOUNTER — Emergency Department (HOSPITAL_COMMUNITY)

## 2023-10-16 DIAGNOSIS — N451 Epididymitis: Secondary | ICD-10-CM

## 2023-10-16 DIAGNOSIS — N50811 Right testicular pain: Secondary | ICD-10-CM

## 2023-10-16 LAB — URINALYSIS, MICROSCOPIC (REFLEX)

## 2023-10-16 LAB — URINALYSIS, ROUTINE W REFLEX MICROSCOPIC
Bilirubin Urine: NEGATIVE
Glucose, UA: NEGATIVE mg/dL
Ketones, ur: NEGATIVE mg/dL
Nitrite: NEGATIVE
Protein, ur: NEGATIVE mg/dL
Specific Gravity, Urine: 1.02 (ref 1.005–1.030)
pH: 6 (ref 5.0–8.0)

## 2023-10-16 MED ORDER — DOXYCYCLINE HYCLATE 100 MG PO TABS
100.0000 mg | ORAL_TABLET | Freq: Once | ORAL | Status: AC
  Administered 2023-10-16: 100 mg via ORAL
  Filled 2023-10-16: qty 1

## 2023-10-16 MED ORDER — STERILE WATER FOR INJECTION IJ SOLN
INTRAMUSCULAR | Status: AC
Start: 2023-10-16 — End: 2023-10-16
  Administered 2023-10-16: 2.1 mL
  Filled 2023-10-16: qty 10

## 2023-10-16 MED ORDER — DOXYCYCLINE HYCLATE 100 MG PO CAPS
100.0000 mg | ORAL_CAPSULE | Freq: Two times a day (BID) | ORAL | 0 refills | Status: AC
Start: 2023-10-16 — End: ?

## 2023-10-16 MED ORDER — CEFTRIAXONE SODIUM 1 G IJ SOLR
500.0000 mg | Freq: Once | INTRAMUSCULAR | Status: AC
  Administered 2023-10-16: 500 mg via INTRAMUSCULAR
  Filled 2023-10-16: qty 10

## 2023-10-16 NOTE — ED Provider Notes (Signed)
 [place] [facility] Provider Note   CSN: [id number] Arrival date & time: 10/15/23  2353     Patient presents with: Testicle Pain   Shawn Preston is a 63 y.o. male.   The history is provided by the patient.  Testicle Pain This is a new problem. The current episode started 6 to 12 hours ago. The problem occurs constantly. The problem has been rapidly worsening. Associated symptoms include abdominal pain. Nothing relieves the symptoms.   Patient presents w/right testicle pain.  Patient reports over the past day has had increasing pain and swelling in the right testicle.  No trauma.  No difficulty urinating.  He has never had this before.  He did have a prostate biopsy about 3 weeks ago.     Prior to Admission medications   Medication Sig Start Date End Date Taking? Authorizing Provider  albuterol  (PROVENTIL  HFA;VENTOLIN  HFA) 108 (90 BASE) MCG/ACT inhaler Inhale into the lungs every 6 (six) hours as needed for wheezing or shortness of breath.    [provider]  amLODipine (NORVASC) 10 MG tablet Take 10 mg by mouth daily.    [provider]  azithromycin  (ZITHROMAX ) 250 MG tablet Take 1 tablet (250 mg total) by mouth daily. Take first 2 tablets together, then 1 every day until finished. 03/26/16   Vicky Charleston, PA-C  benzonatate  (TESSALON ) 100 MG capsule Take 1 capsule (100 mg total) by mouth every 8 (eight) hours. 03/26/16   Vicky Charleston, PA-C  fluticasone-salmeterol (ADVAIR HFA) 230-21 MCG/ACT inhaler Inhale 2 puffs into the lungs 2 (two) times daily.    [provider]  gabapentin (NEURONTIN) 300 MG capsule Take 300 mg by mouth 3 (three) times daily.    [provider]  hydrOXYzine  (ATARAX ) 25 MG tablet Take 1 tablet (25 mg total) by mouth every 6 (six) hours as needed for itching. 11/12/22   Francesca Elsie CROME, MD  methocarbamol  (ROBAXIN ) 500 MG tablet Take 1 tablet (500 mg total) by mouth 2 (two) times  daily. 02/27/15   Jarold Olam HERO, PA-C  metoCLOPramide  (REGLAN ) 10 MG tablet Take 1 tablet (10 mg total) by mouth every 6 (six) hours. 04/06/23   Mannie Pac T, DO  naproxen  (NAPROSYN ) 500 MG tablet Take 1 tablet (500 mg total) by mouth 2 (two) times daily with a meal. 02/27/15   Jarold Olam HERO, PA-C  omeprazole (PRILOSEC) 10 MG capsule Take 10 mg by mouth daily.    [provider]  oxyCODONE -acetaminophen  (PERCOCET/ROXICET) 5-325 MG tablet Take 1 tablet by mouth every 6 (six) hours as needed for severe pain (pain score 7-10). 10/14/23   Dreama Longs, MD  potassium chloride  SA (K-DUR,KLOR-CON ) 20 MEQ tablet Take 1 tablet (20 mEq total) by mouth daily. 03/23/18   Freddi Hamilton, MD    Allergies: Tramadol and Zoloft [sertraline]    Review of Systems  Gastrointestinal:  Positive for abdominal pain.  Genitourinary:  Positive for testicular pain.    Updated Vital Signs BP 137/81 (BP Location: Right Arm)   Pulse 86   Temp 98.1 F (36.7 C)   Resp 20   Ht 1.829 m (6')   Wt 86.2 kg   SpO2 90%   BMI 25.77 kg/m   Physical Exam CONSTITUTIONAL: Well developed/well nourished HEAD: Normocephalic/atraumatic ABDOMEN: soft, nontender, no rebound or guarding, bowel sounds noted throughout abdomen GU: Testicles descended bilaterally.  Right testicle is enlarged and tender to palpation No overlying erythema or crepitus. There is no inguinal hernia  Chaperone present for exam NEURO: Pt is awake/alert/appropriate, moves all extremitiesx4.  No facial droop.   EXTREMITIES: pulses normal/equal, full ROM SKIN: warm, color normal PSYCH: no abnormalities of mood noted, alert and oriented to situation  (all labs ordered are listed, but only abnormal results are displayed) Labs Reviewed  URINE CULTURE  URINALYSIS, ROUTINE W REFLEX MICROSCOPIC    EKG: None  Radiology: CT ABDOMEN PELVIS W CONTRAST Result Date: 10/14/2023 CLINICAL DATA:  prostate infection. Recent prostate biopsy. UTI  from procedure. EXAM: CT ABDOMEN AND PELVIS WITH CONTRAST TECHNIQUE: Multidetector CT imaging of the abdomen and pelvis was performed using the standard protocol following bolus administration of intravenous contrast. RADIATION DOSE REDUCTION: This exam was performed according to the departmental dose-optimization program which includes automated exposure control, adjustment of the mA and/or kV according to patient size and/or use of iterative reconstruction technique. CONTRAST:  OMNIPAQUE  IOHEXOL  300 MG/ML  SOLN COMPARISON:  CT scan abdomen and pelvis from 04/06/2023. FINDINGS: Lower chest: The lung bases are clear. No pleural effusion. The heart is normal in size. No pericardial effusion. Hepatobiliary: The liver is normal in size. Non-cirrhotic configuration. No suspicious mass. These is mild diffuse hepatic steatosis. No intrahepatic or extrahepatic bile duct dilation. No calcified gallstones. Normal gallbladder wall thickness. No pericholecystic inflammatory changes. Pancreas: Unremarkable. No pancreatic ductal dilatation or surrounding inflammatory changes. Spleen: Within normal limits. No focal lesion. Adrenals/Urinary Tract: Adrenal glands are unremarkable. No suspicious renal mass. There are stable single simple cyst in each kidneys with largest in the left kidney measuring up to 3.7 x 4.4 cm. No nephroureterolithiasis or obstructive uropathy on either side Unremarkable urinary bladder. Stomach/Bowel: No disproportionate dilation of the small or large bowel loops. No evidence of abnormal bowel wall thickening or inflammatory changes. The appendix is unremarkable. Vascular/Lymphatic: No ascites or pneumoperitoneum. No abdominal or pelvic lymphadenopathy, by size criteria. No aneurysmal dilation of the major abdominal arteries. There are mild peripheral atherosclerotic vascular calcifications of the aorta and its major branches. Reproductive: Normal size prostate. No discrete prostatic abscess seen. No  significant Peri prostatic fat stranding. Symmetric seminal vesicles. Other: There is a tiny fat containing umbilical hernia. The soft tissues and abdominal wall are otherwise unremarkable. Musculoskeletal: No suspicious osseous lesions. There are mild multilevel degenerative changes in the visualized spine. IMPRESSION: 1. No acute inflammatory process identified within the abdomen or pelvis. No discrete prostatic abscess seen. No significant peri prostatic fat stranding. 2. Multiple other nonacute observations, as described above. Aortic Atherosclerosis (ICD10-I70.0). Electronically Signed   By: Ree Molt M.D.   On: 10/14/2023 12:28     Procedures   Medications Ordered in the ED - No data to display                                  Medical Decision Making Amount and/or Complexity of Data Reviewed Labs: ordered.   Patient presents for right testicle pain.  Differential includes torsion as well as epididymitis or orchitis.  No overlying signs of erythema to suggest cellulitis  Patient will need emergent ultrasound tonight.  This was discussed at length with patient and his brother. His brother will drive him to the [facility], ER for emergent ultrasound as patient does not want a wait for a transport  accepted at [facility] by Dr. Griselda Urinalysis and urine culture have been sent    Final diagnoses:  Pain in right testicle  ED Discharge Orders     None          Midge Golas, MD 10/16/23 [phone]

## 2023-10-16 NOTE — Discharge Instructions (Addendum)
 Please follow-up with your urologist.  Take medications as prescribed.  Return for new or worsening symptoms.

## 2023-10-16 NOTE — ED Provider Notes (Signed)
 Patient transferred from med center [facility] for right testicle pain.  Had recent prostate biopsy about 3 weeks ago.  Had been treated for UTI with Levaquin, just finished his last dose.  Was sent over for ultrasound to rule out torsion.  Ultrasound notable for epididymitis.  Will be treated with rocephin  IM and doxy.  He has follow-up with urology in 10 days.  Vital signs are stable.  He does not appear toxic.  Doubt prostatitis.  I do not think that he requires any further observation or evaluation in the ED tonight.   Vicky Charleston, PA-C 10/16/23 0255    Griselda Norris, MD 10/16/23 [phone]

## 2023-10-16 NOTE — ED Notes (Signed)
 Pt is here for his ultrasound. Called ultrasound and told them.

## 2023-10-17 LAB — URINE CULTURE: Culture: NO GROWTH

## 2023-11-03 ENCOUNTER — Encounter (HOSPITAL_BASED_OUTPATIENT_CLINIC_OR_DEPARTMENT_OTHER): Payer: Self-pay

## 2023-11-03 ENCOUNTER — Emergency Department (HOSPITAL_BASED_OUTPATIENT_CLINIC_OR_DEPARTMENT_OTHER): Admission: EM | Admit: 2023-11-03 | Discharge: 2023-11-03 | Disposition: A | Discharge status: Home / Self Care

## 2023-11-03 DIAGNOSIS — M545 Low back pain, unspecified: Secondary | ICD-10-CM | POA: Diagnosis present

## 2023-11-03 LAB — URINALYSIS, ROUTINE W REFLEX MICROSCOPIC
Bilirubin Urine: NEGATIVE
Glucose, UA: NEGATIVE mg/dL
Hgb urine dipstick: NEGATIVE
Ketones, ur: NEGATIVE mg/dL
Leukocytes,Ua: NEGATIVE
Nitrite: NEGATIVE
Protein, ur: NEGATIVE mg/dL
Specific Gravity, Urine: <=1.005 (ref 1.005–1.030)
pH: 6 (ref 5.0–8.0)

## 2023-11-03 MED ORDER — LIDOCAINE 5 % EX PTCH
1.0000 | MEDICATED_PATCH | Freq: Once | CUTANEOUS | Status: DC
  Administered 2023-11-03: 1 via TRANSDERMAL
  Filled 2023-11-03: qty 1

## 2023-11-03 MED ORDER — ACETAMINOPHEN 500 MG PO TABS
1000.0000 mg | ORAL_TABLET | Freq: Once | ORAL | Status: AC
  Administered 2023-11-03: 1000 mg via ORAL
  Filled 2023-11-03: qty 2

## 2023-11-03 MED ORDER — LIDOCAINE 5 % EX PTCH
1.0000 | MEDICATED_PATCH | CUTANEOUS | 0 refills | Status: AC
Start: 2023-11-03 — End: ?

## 2023-11-03 MED ORDER — METHOCARBAMOL 500 MG PO TABS: 500.0000 mg | ORAL_TABLET | Freq: Two times a day (BID) | ORAL | 0 refills | Status: AC | PRN

## 2023-11-03 NOTE — ED Notes (Signed)
 Discharge instructions reviewed with patient. Patient verbalizes understanding, no further questions at this time. Medications/prescriptions and follow up information provided. No acute distress noted at time of departure.

## 2023-11-03 NOTE — ED Provider Notes (Signed)
 [place] [facility] Provider Note   CSN: [id number] Arrival date & time: 11/03/23  1053     Patient presents with: Back Pain   Shawn Preston is a 63 y.o. male.   63 year old male presenting emergency department for back pain.  Noted some symptoms last night, no overt trauma or injury that he can recall.  Follow-up this morning.  Diffusely across low back.  Described as tight.  Worse with movement, better with rest.  No saddle anesthesia.  Has some residual left-sided weakness from prior stroke, but no new weakness.  No fevers.  No immunosuppression.   Back Pain      Prior to Admission medications   Medication Sig Start Date End Date Taking? Authorizing Provider  lidocaine  (LIDODERM ) 5 % Place 1 patch onto the skin daily. Remove & Discard patch within 12 hours or as directed by MD 11/03/23  Yes Neysa Caron PARAS, DO  methocarbamol  (ROBAXIN ) 500 MG tablet Take 1 tablet (500 mg total) by mouth 2 (two) times daily as needed for muscle spasms. 11/03/23  Yes Neysa Caron PARAS, DO  albuterol  (PROVENTIL  HFA;VENTOLIN  HFA) 108 (90 BASE) MCG/ACT inhaler Inhale into the lungs every 6 (six) hours as needed for wheezing or shortness of breath.    [provider]  amLODipine (NORVASC) 10 MG tablet Take 10 mg by mouth daily.    [provider]  azithromycin  (ZITHROMAX ) 250 MG tablet Take 1 tablet (250 mg total) by mouth daily. Take first 2 tablets together, then 1 every day until finished. 03/26/16   Vicky Charleston, PA-C  benzonatate  (TESSALON ) 100 MG capsule Take 1 capsule (100 mg total) by mouth every 8 (eight) hours. 03/26/16   Vicky Charleston, PA-C  doxycycline  (VIBRAMYCIN ) 100 MG capsule Take 1 capsule (100 mg total) by mouth 2 (two) times daily. 10/16/23   Vicky Charleston, PA-C  fluticasone-salmeterol (ADVAIR HFA) 230-21 MCG/ACT inhaler Inhale 2 puffs into the lungs 2 (two) times daily.    [provider]  gabapentin (NEURONTIN) 300 MG  capsule Take 300 mg by mouth 3 (three) times daily.    [provider]  hydrOXYzine  (ATARAX ) 25 MG tablet Take 1 tablet (25 mg total) by mouth every 6 (six) hours as needed for itching. 11/12/22   Francesca Elsie CROME, MD  metoCLOPramide  (REGLAN ) 10 MG tablet Take 1 tablet (10 mg total) by mouth every 6 (six) hours. 04/06/23   Mannie Fairy DASEN, DO  naproxen  (NAPROSYN ) 500 MG tablet Take 1 tablet (500 mg total) by mouth 2 (two) times daily with a meal. 02/27/15   Jarold Olam HERO, PA-C  omeprazole (PRILOSEC) 10 MG capsule Take 10 mg by mouth daily.    [provider]  oxyCODONE -acetaminophen  (PERCOCET/ROXICET) 5-325 MG tablet Take 1 tablet by mouth every 6 (six) hours as needed for severe pain (pain score 7-10). 10/14/23   Dreama Longs, MD  potassium chloride  SA (K-DUR,KLOR-CON ) 20 MEQ tablet Take 1 tablet (20 mEq total) by mouth daily. 03/23/18   Freddi Hamilton, MD    Allergies: Tramadol and Zoloft [sertraline]    Review of Systems  Musculoskeletal:  Positive for back pain.    Updated Vital Signs BP 123/71 (BP Location: Left Arm)   Pulse 80   Temp 98 F (36.7 C) (Oral)   Resp 17   Ht 6' (1.829 m)   Wt 80.7 kg   SpO2 96%   BMI 24.14 kg/m   Physical Exam Vitals and nursing note reviewed.  Constitutional:  General: He is not in acute distress.    Appearance: He is not toxic-appearing.  HENT:     Head: Normocephalic.     Mouth/Throat:     Mouth: Mucous membranes are moist.  Eyes:     Conjunctiva/sclera: Conjunctivae normal.  Pulmonary:     Effort: Pulmonary effort is normal.  Musculoskeletal:     Comments: No midline spinal tenderness.  Does have some tenderness across the quadratus lumborum along the iliac crest.  Normal sensation.  5 out of 5 plantarflexion dorsiflexion on the right, 4-5 plantarflexion dorsiflexion of the left  Skin:    General: Skin is warm and dry.     Capillary Refill: Capillary refill takes less than 2 seconds.  Neurological:      Mental Status: He is alert and oriented to person, place, and time.  Psychiatric:        Mood and Affect: Mood normal.        Behavior: Behavior normal.     (all labs ordered are listed, but only abnormal results are displayed) Labs Reviewed  URINALYSIS, ROUTINE W REFLEX MICROSCOPIC - Abnormal; Notable for the following components:      Result Value   Color, Urine STRAW (*)    All other components within normal limits    EKG: None  Radiology: No results found.   Procedures   Medications Ordered in the ED  lidocaine  (LIDODERM ) 5 % 1 patch (1 patch Transdermal Patch Applied 11/03/23 1217)  acetaminophen  (TYLENOL ) tablet 1,000 mg (1,000 mg Oral Given 11/03/23 1216)                                    Medical Decision Making Well-appearing 63 year old male presenting emergency department for low back pain.  Is afebrile nontachycardic, normotensive.  Physical exam reassuring with no midline spinal tenderness, does have some tenderness to the musculature of his quadratus lumborum.  He reported symptoms felt better after hot compress.  No red flags for cauda equina.  Does have some weakness in left leg compared to the right, patient states this is at his baseline.  Strongly suspect muscular etiology.  Discussed supportive medications and follow-up with PCP.    Amount and/or Complexity of Data Reviewed Labs: ordered. Radiology:     Details: Will forego imaging is a low suspicion for acute osseous abnormality or cauda equina at this time.  Risk OTC drugs. Prescription drug management.       Final diagnoses:  Acute bilateral low back pain without sciatica    ED Discharge Orders          Ordered    methocarbamol  (ROBAXIN ) 500 MG tablet  2 times daily PRN        11/03/23 1211    lidocaine  (LIDODERM ) 5 %  Every 24 hours        11/03/23 1211               Neysa Caron PARAS, DO 11/03/23 1323

## 2023-11-03 NOTE — Discharge Instructions (Addendum)
 May take over-the-counter Tylenol  every 6 hours with dosages as directed on the packaging for baseline pain control.  You may also use lidocaine  patches and muscle relaxer.  Please follow-up with your primary doctor.  Return show fevers, chills, severe pain, numbness in your genital area, bowel or bladder incontinence, weakness in your lower extremities or any new or worsening symptoms that are concerning to you.

## 2023-11-03 NOTE — ED Triage Notes (Addendum)
 C/o lower back pain since last night. Has had ongoing right testicle pain since he was seen at [facility], improved slightly but still there. Took antibiotics. States had painful urination that was cleared up with the antibiotics.
